# Patient Record
Sex: Male | Born: 1945 | Race: White | Hispanic: No | Smoking: Never smoker
Health system: Southern US, Community
[De-identification: ages and names within clinical notes are randomized; demographics above are authoritative.]

## PROBLEM LIST (undated history)

## (undated) DIAGNOSIS — I10 Essential (primary) hypertension: Secondary | ICD-10-CM

---

## 2020-04-18 ENCOUNTER — Emergency Department (HOSPITAL_COMMUNITY): Payer: Medicare Other

## 2020-04-18 ENCOUNTER — Inpatient Hospital Stay (HOSPITAL_COMMUNITY)
Admission: EM | Admit: 2020-04-18 | Discharge: 2020-04-27 | DRG: 177 | Disposition: A | Discharge status: Home Health | Payer: Medicare Other | Attending: Internal Medicine | Admitting: Internal Medicine

## 2020-04-18 ENCOUNTER — Other Ambulatory Visit: Payer: Self-pay

## 2020-04-18 ENCOUNTER — Encounter (HOSPITAL_COMMUNITY): Payer: Self-pay | Admitting: Emergency Medicine

## 2020-04-18 DIAGNOSIS — I10 Essential (primary) hypertension: Secondary | ICD-10-CM

## 2020-04-18 DIAGNOSIS — Z79899 Other long term (current) drug therapy: Secondary | ICD-10-CM | POA: Diagnosis not present

## 2020-04-18 DIAGNOSIS — U071 COVID-19: Secondary | ICD-10-CM | POA: Diagnosis present

## 2020-04-18 DIAGNOSIS — J1282 Pneumonia due to coronavirus disease 2019: Principal | ICD-10-CM

## 2020-04-18 DIAGNOSIS — T380X5A Adverse effect of glucocorticoids and synthetic analogues, initial encounter: Secondary | ICD-10-CM | POA: Diagnosis not present

## 2020-04-18 DIAGNOSIS — R0602 Shortness of breath: Secondary | ICD-10-CM | POA: Diagnosis present

## 2020-04-18 DIAGNOSIS — Z681 Body mass index (BMI) 19 or less, adult: Secondary | ICD-10-CM | POA: Diagnosis not present

## 2020-04-18 DIAGNOSIS — E785 Hyperlipidemia, unspecified: Secondary | ICD-10-CM | POA: Diagnosis present

## 2020-04-18 DIAGNOSIS — J9601 Acute respiratory failure with hypoxia: Secondary | ICD-10-CM | POA: Diagnosis present

## 2020-04-18 DIAGNOSIS — Z20822 Contact with and (suspected) exposure to covid-19: Secondary | ICD-10-CM | POA: Diagnosis present

## 2020-04-18 DIAGNOSIS — J9600 Acute respiratory failure, unspecified whether with hypoxia or hypercapnia: Secondary | ICD-10-CM | POA: Diagnosis present

## 2020-04-18 DIAGNOSIS — I110 Hypertensive heart disease with heart failure: Secondary | ICD-10-CM | POA: Diagnosis present

## 2020-04-18 DIAGNOSIS — R739 Hyperglycemia, unspecified: Secondary | ICD-10-CM | POA: Diagnosis not present

## 2020-04-18 DIAGNOSIS — R7989 Other specified abnormal findings of blood chemistry: Secondary | ICD-10-CM | POA: Diagnosis not present

## 2020-04-18 DIAGNOSIS — J069 Acute upper respiratory infection, unspecified: Secondary | ICD-10-CM | POA: Diagnosis present

## 2020-04-18 DIAGNOSIS — R413 Other amnesia: Secondary | ICD-10-CM | POA: Diagnosis present

## 2020-04-18 DIAGNOSIS — R636 Underweight: Secondary | ICD-10-CM | POA: Diagnosis present

## 2020-04-18 DIAGNOSIS — I5031 Acute diastolic (congestive) heart failure: Secondary | ICD-10-CM | POA: Diagnosis present

## 2020-04-18 HISTORY — DX: Essential (primary) hypertension: I10

## 2020-04-18 LAB — RESPIRATORY PANEL BY RT PCR (FLU A&B, COVID)
Influenza A by PCR: NEGATIVE
Influenza B by PCR: NEGATIVE
SARS Coronavirus 2 by RT PCR: POSITIVE — AB

## 2020-04-18 LAB — CBC WITH DIFFERENTIAL/PLATELET
Abs Immature Granulocytes: 0.05 10*3/uL (ref 0.00–0.07)
Basophils Absolute: 0 10*3/uL (ref 0.0–0.1)
Basophils Relative: 0 %
Eosinophils Absolute: 0 10*3/uL (ref 0.0–0.5)
Eosinophils Relative: 0 %
HCT: 43.9 % (ref 39.0–52.0)
Hemoglobin: 14.6 g/dL (ref 13.0–17.0)
Immature Granulocytes: 1 %
Lymphocytes Relative: 5 %
Lymphs Abs: 0.3 10*3/uL — ABNORMAL LOW (ref 0.7–4.0)
MCH: 27.2 pg (ref 26.0–34.0)
MCHC: 33.3 g/dL (ref 30.0–36.0)
MCV: 81.8 fL (ref 80.0–100.0)
Monocytes Absolute: 0.1 10*3/uL (ref 0.1–1.0)
Monocytes Relative: 2 %
Neutro Abs: 4.4 10*3/uL (ref 1.7–7.7)
Neutrophils Relative %: 92 %
Platelets: 151 10*3/uL (ref 150–400)
RBC: 5.37 MIL/uL (ref 4.22–5.81)
RDW: 12.6 % (ref 11.5–15.5)
WBC: 4.9 10*3/uL (ref 4.0–10.5)
nRBC: 0 % (ref 0.0–0.2)

## 2020-04-18 LAB — COMPREHENSIVE METABOLIC PANEL
ALT: 36 U/L (ref 0–44)
AST: 61 U/L — ABNORMAL HIGH (ref 15–41)
Albumin: 2.8 g/dL — ABNORMAL LOW (ref 3.5–5.0)
Alkaline Phosphatase: 28 U/L — ABNORMAL LOW (ref 38–126)
Anion gap: 12 (ref 5–15)
BUN: 30 mg/dL — ABNORMAL HIGH (ref 8–23)
CO2: 24 mmol/L (ref 22–32)
Calcium: 8.4 mg/dL — ABNORMAL LOW (ref 8.9–10.3)
Chloride: 104 mmol/L (ref 98–111)
Creatinine, Ser: 1.17 mg/dL (ref 0.61–1.24)
GFR, Estimated: >60 mL/min (ref >60)
Glucose, Bld: 140 mg/dL — ABNORMAL HIGH (ref 70–99)
Potassium: 4 mmol/L (ref 3.5–5.1)
Sodium: 140 mmol/L (ref 135–145)
Total Bilirubin: 1.7 mg/dL — ABNORMAL HIGH (ref 0.3–1.2)
Total Protein: 6.7 g/dL (ref 6.5–8.1)

## 2020-04-18 LAB — LACTATE DEHYDROGENASE: LDH: 670 U/L — ABNORMAL HIGH (ref 98–192)

## 2020-04-18 LAB — TRIGLYCERIDES: Triglycerides: 170 mg/dL — ABNORMAL HIGH (ref <150)

## 2020-04-18 LAB — D-DIMER, QUANTITATIVE: D-Dimer, Quant: 3.5 ug/mL-FEU — ABNORMAL HIGH (ref 0.00–0.50)

## 2020-04-18 LAB — C-REACTIVE PROTEIN: CRP: 21.4 mg/dL — ABNORMAL HIGH (ref <1.0)

## 2020-04-18 LAB — PROCALCITONIN: Procalcitonin: 2.31 ng/mL

## 2020-04-18 LAB — LACTIC ACID, PLASMA: Lactic Acid, Venous: 2.9 mmol/L (ref 0.5–1.9)

## 2020-04-18 LAB — FERRITIN: Ferritin: >7500 ng/mL — ABNORMAL HIGH (ref 24–336)

## 2020-04-18 LAB — FIBRINOGEN: Fibrinogen: 759 mg/dL — ABNORMAL HIGH (ref 210–475)

## 2020-04-18 MED ORDER — HYDROCHLOROTHIAZIDE 12.5 MG PO CAPS: 12.5000 mg | ORAL_CAPSULE | Freq: Every day | ORAL | Status: DC

## 2020-04-18 MED ORDER — SODIUM CHLORIDE 0.9 % IV SOLN
2.0000 g | INTRAVENOUS | Status: AC
  Administered 2020-04-19 – 2020-04-23 (×5): 2 g via INTRAVENOUS
  Filled 2020-04-18 (×5): qty 20

## 2020-04-18 MED ORDER — ACETAMINOPHEN 650 MG RE SUPP: 650.0000 mg | Freq: Four times a day (QID) | RECTAL | Status: DC | PRN

## 2020-04-18 MED ORDER — PREDNISONE 20 MG PO TABS: 50.0000 mg | ORAL_TABLET | Freq: Every day | ORAL | Status: DC

## 2020-04-18 MED ORDER — LISINOPRIL 10 MG PO TABS
10.0000 mg | ORAL_TABLET | Freq: Every day | ORAL | Status: DC
  Administered 2020-04-19 – 2020-04-21 (×3): 10 mg via ORAL
  Filled 2020-04-18 (×3): qty 1

## 2020-04-18 MED ORDER — SODIUM CHLORIDE 0.9 % IV BOLUS
500.0000 mL | Freq: Once | INTRAVENOUS | Status: AC
  Administered 2020-04-18: 500 mL via INTRAVENOUS

## 2020-04-18 MED ORDER — ACETAMINOPHEN 325 MG PO TABS: 650.0000 mg | ORAL_TABLET | Freq: Four times a day (QID) | ORAL | Status: DC | PRN

## 2020-04-18 MED ORDER — METHYLPREDNISOLONE SODIUM SUCC 40 MG IJ SOLR
0.5000 mg/kg | Freq: Two times a day (BID) | INTRAMUSCULAR | Status: DC
  Administered 2020-04-19: 30.8 mg via INTRAVENOUS
  Filled 2020-04-18: qty 1

## 2020-04-18 MED ORDER — SODIUM CHLORIDE 0.9 % IV SOLN
2.0000 g | Freq: Once | INTRAVENOUS | Status: AC
  Administered 2020-04-18: 2 g via INTRAVENOUS
  Filled 2020-04-18: qty 20

## 2020-04-18 MED ORDER — METHYLPREDNISOLONE SODIUM SUCC 125 MG IJ SOLR
125.0000 mg | Freq: Once | INTRAMUSCULAR | Status: AC
  Administered 2020-04-18: 125 mg via INTRAVENOUS
  Filled 2020-04-18: qty 2

## 2020-04-18 MED ORDER — ASCORBIC ACID 500 MG PO TABS
500.0000 mg | ORAL_TABLET | Freq: Every day | ORAL | Status: DC
  Administered 2020-04-19 – 2020-04-27 (×9): 500 mg via ORAL
  Filled 2020-04-18 (×9): qty 1

## 2020-04-18 MED ORDER — SODIUM CHLORIDE 0.9 % IV SOLN
100.0000 mg | Freq: Every day | INTRAVENOUS | Status: AC
  Administered 2020-04-19 – 2020-04-22 (×4): 100 mg via INTRAVENOUS
  Filled 2020-04-18 (×5): qty 20

## 2020-04-18 MED ORDER — PRAVASTATIN SODIUM 40 MG PO TABS
40.0000 mg | ORAL_TABLET | Freq: Every day | ORAL | Status: DC
  Administered 2020-04-19 – 2020-04-26 (×8): 40 mg via ORAL
  Filled 2020-04-18 (×8): qty 1

## 2020-04-18 MED ORDER — SODIUM CHLORIDE 0.9 % IV SOLN
200.0000 mg | Freq: Once | INTRAVENOUS | Status: AC
  Administered 2020-04-18: 200 mg via INTRAVENOUS
  Filled 2020-04-18: qty 40

## 2020-04-18 MED ORDER — ZINC SULFATE 220 (50 ZN) MG PO CAPS
220.0000 mg | ORAL_CAPSULE | Freq: Every day | ORAL | Status: DC
  Administered 2020-04-19 – 2020-04-27 (×9): 220 mg via ORAL
  Filled 2020-04-18 (×9): qty 1

## 2020-04-18 MED ORDER — SODIUM CHLORIDE 0.9 % IV SOLN
500.0000 mg | INTRAVENOUS | Status: AC
  Administered 2020-04-19 – 2020-04-21 (×3): 500 mg via INTRAVENOUS
  Filled 2020-04-18 (×5): qty 500

## 2020-04-18 MED ORDER — ACETAMINOPHEN 325 MG PO TABS
650.0000 mg | ORAL_TABLET | Freq: Once | ORAL | Status: AC
  Administered 2020-04-18: 650 mg via ORAL
  Filled 2020-04-18: qty 2

## 2020-04-18 MED ORDER — LISINOPRIL-HYDROCHLOROTHIAZIDE 10-12.5 MG PO TABS: 1.0000 | ORAL_TABLET | Freq: Every day | ORAL | Status: DC

## 2020-04-18 MED ORDER — ENOXAPARIN SODIUM 40 MG/0.4ML ~~LOC~~ SOLN
40.0000 mg | SUBCUTANEOUS | Status: DC
  Administered 2020-04-19 – 2020-04-20 (×2): 40 mg via SUBCUTANEOUS
  Filled 2020-04-18 (×2): qty 0.4

## 2020-04-18 NOTE — H&P (Signed)
History and Physical    Trevor Martinez SFK:[id number] DOB: 1946-05-12 DOA: 04/18/2020  PCP: No primary care provider on file.  Patient coming from: Home.  Chief Complaint: Hypoxia.  HPI: Trevor Martinez is a 74 y.o. male with history of hypertension and hyperlipidemia was brought to the ER after patient was found to be hypoxic febrile and was having some diarrhea and feeling weak.  Patient's neighbor called in for a wellness check since patient was not seen outside the house for the last 2 weeks.  Patient is a very poor historian and states he is here to the hospital because of Covid.  Denies any chest pain shortness of breath.  ED Course: In the ER patient was requiring 6 L oxygen to maintain sats more than 90%.  Patient was febrile with temperature 102 F.  Chest x-ray showed bilateral infiltrates and also hilar fullness.  Covid test was positive.  Labs are significant for CRP of 21.4 lactic acid 2.9 procalcitonin 2.3 mildly elevated AST.  D-dimer was 3.5.  EKG shows sinus tachycardia.  Blood cultures obtained and patient started on antiviral steroids for Covid and also since patient has elevated procalcitonin empiric antibiotics were started.  Review of Systems: As per HPI, rest all negative.   Past Medical History:  Diagnosis Date  . Hypertension     History reviewed. No pertinent surgical history.   reports that he has never smoked. He has never used smokeless tobacco. No history on file for alcohol use and drug use.  No Known Allergies  Family History  Family history unknown: Yes    Prior to Admission medications   Medication Sig Start Date End Date Taking? Authorizing Provider  lisinopril-hydrochlorothiazide (ZESTORETIC) 10-12.5 MG tablet Take 1 tablet by mouth daily.   Yes [provider]  pravastatin (PRAVACHOL) 40 MG tablet Take 40 mg by mouth daily with supper.   Yes [provider]    Physical Exam: Constitutional: Moderately built and  nourished. Vitals:   04/18/20 2100 04/18/20 2115 04/18/20 2130 04/18/20 2145  BP: 116/68 113/69 111/67 113/61  Pulse: 82 84 81 73  Resp: 20 (!) 21 (!) 21 (!) 22  Temp:      TempSrc:      SpO2: 100% 100% 100% 100%  Weight:      Height:       Eyes: Anicteric no pallor. ENMT: No discharge from the ears eyes nose or mouth. Neck: No mass felt.  No neck rigidity. Respiratory: No rhonchi or crepitations. Cardiovascular: S1-S2 heard. Abdomen: Soft nontender bowel sounds present. Musculoskeletal: No edema. Skin: No rash. Neurologic: Alert awake oriented to his name and place moves all extremities. Psychiatric: Oriented to name and place.   Labs on Admission: I have personally reviewed following labs and imaging studies  CBC: Recent Labs  Lab 04/18/20 1711  WBC 4.9  NEUTROABS 4.4  HGB 14.6  HCT 43.9  MCV 81.8  PLT 151   Basic Metabolic Panel: Recent Labs  Lab 04/18/20 1711  NA 140  K 4.0  CL 104  CO2 24  GLUCOSE 140*  BUN 30*  CREATININE 1.17  CALCIUM 8.4*   GFR: Estimated Creatinine Clearance: 48.7 mL/min (by C-G formula based on SCr of 1.17 mg/dL). Liver Function Tests: Recent Labs  Lab 04/18/20 1711  AST 61*  ALT 36  ALKPHOS 28*  BILITOT 1.7*  PROT 6.7  ALBUMIN 2.8*   No results for input(s): LIPASE, AMYLASE in the last 168 hours. No results for input(s):  AMMONIA in the last 168 hours. Coagulation Profile: No results for input(s): INR, PROTIME in the last 168 hours. Cardiac Enzymes: No results for input(s): CKTOTAL, CKMB, CKMBINDEX, TROPONINI in the last 168 hours. BNP (last 3 results) No results for input(s): PROBNP in the last 8760 hours. HbA1C: No results for input(s): HGBA1C in the last 72 hours. CBG: No results for input(s): GLUCAP in the last 168 hours. Lipid Profile: Recent Labs    04/18/20 1711  TRIG 170*   Thyroid Function Tests: No results for input(s): TSH, T4TOTAL, FREET4, T3FREE, THYROIDAB in the last 72 hours. Anemia  Panel: Recent Labs    04/18/20 1711  FERRITIN >7,500*   Urine analysis: No results found for: COLORURINE, APPEARANCEUR, LABSPEC, PHURINE, GLUCOSEU, HGBUR, BILIRUBINUR, KETONESUR, PROTEINUR, UROBILINOGEN, NITRITE, LEUKOCYTESUR Sepsis Labs: @LABRCNTIP (procalcitonin:4,lacticidven:4) ) Recent Results (from the past 240 hour(s))  Respiratory Panel by RT PCR (Flu A&B, Covid) - Nasopharyngeal Swab     Status: Abnormal   Collection Time: 04/18/20  6:09 PM   Specimen: Nasopharyngeal Swab  Result Value Ref Range Status   SARS Coronavirus 2 by RT PCR POSITIVE (A) NEGATIVE Final    Comment: RESULT CALLED TO, READ BACK BY AND VERIFIED WITH: DR D RAY @2029  04/18/20 BY S GEZAHEGN (NOTE) SARS-CoV-2 target nucleic acids are DETECTED.  SARS-CoV-2 RNA is generally detectable in upper respiratory specimens  during the acute phase of infection. Positive results are indicative of the presence of the identified virus, but do not rule out bacterial infection or co-infection with other pathogens not detected by the test. Clinical correlation with patient history and other diagnostic information is necessary to determine patient infection status. The expected result is Negative.  Fact Sheet for Patients:   Fact Sheet for Healthcare Providers: 04/20/20  This test is not yet approved or cleared by the https://www.moore.com/ FDA and  has been authorized for detection and/or diagnosis of SARS-CoV-2 by FDA under an Emergency Use Authorization (EUA).  This EUA will remain in effect (meaning this test can  be used) for the duration of  the COVID-19 declaration under Section 564(b)(1) of the Act, 21 U.S.C. section 360bbb-3(b)(1), unless the authorization is terminated or revoked sooner.      Influenza A by PCR NEGATIVE NEGATIVE Final   Influenza B by PCR NEGATIVE NEGATIVE Final    Comment: (NOTE) The Xpert Xpress SARS-CoV-2/FLU/RSV  assay is intended as an aid in  the diagnosis of influenza from Nasopharyngeal swab specimens and  should not be used as a sole basis for treatment. Nasal washings and  aspirates are unacceptable for Xpert Xpress SARS-CoV-2/FLU/RSV  testing.  Fact Sheet for Patients: https://www.young.biz/  Fact Sheet for Healthcare Providers: [place]  This test is not yet approved or cleared by the https://www.moore.com/ FDA and  has been authorized for detection and/or diagnosis of SARS-CoV-2 by  FDA under an Emergency Use Authorization (EUA). This EUA will remain  in effect (meaning this test can be used) for the duration of the  Covid-19 declaration under Section 564(b)(1) of the Act, 21  U.S.C. section 360bbb-3(b)(1), unless the authorization is  terminated or revoked. Performed at [facility], [address], [place], [address] [place]      Radiological Exams on Admission: DG Chest Port 1 View  Result Date: 04/18/2020 CLINICAL DATA:  Shortness of breath. EXAM: PORTABLE CHEST 1 VIEW COMPARISON:  None. FINDINGS: The heart is normal in size. There is soft tissue fullness in the left hilar region. Diffuse fine interstitial opacities,  left greater than right and most prominent in the lower lung zones. There is slightly more patchy opacity at the left lung base. No pneumothorax or large pleural effusion. Biapical pleuroparenchymal scarring. No acute osseous abnormalities are seen. IMPRESSION: 1. Diffuse fine interstitial opacities, left greater than right and most prominent in the lower lung zones. Differential considerations favor infection, less likely pulmonary edema, neoplasm, or interstitial lung disease. 2. Soft tissue fullness in the left hilar region. Suspect underlying adenopathy. 3. Recommend clinical correlation. Radiographic follow-up is recommended after course of treatment. Should hilar fullness persist, recommend further characterization  with chest CT. Electronically Signed   By: Narda Rutherford M.D.   On: 04/18/2020 18:40    EKG: Independently reviewed.  Sinus tachycardia.  Assessment/Plan Active Problems:   Acute respiratory failure due to COVID-19 [facility])   Essential hypertension   Acute respiratory disease due to COVID-19 virus    1. Acute respiratory failure with hypoxia presently on 6 L oxygen to maintain sats with chest x-ray showing bilateral infiltrates and positive Covid test likely from Covid pneumonia for which patient is placed on IV remdesivir and IV Solu-Medrol.  Closely monitor respiratory status inflammatory markers.  May need baricitinib if patient develops further hypoxia.  Since patient has elevated procalcitonin levels and I have placed patient on empiric antibiotics follow Cultures lactic acid levels. 2. Since CT scan shows hilar fullness we will get a CT angiogram of the chest to further assess. 3. Hypertension on lisinopril and hydrochlorothiazide. 4. Hyperlipidemia on statins. 5. Patient is a poor historian.  We need to see if he can reach family.  Since patient is acute respiratory failure with hypoxia secondary to Covid infection will need close monitoring and inpatient status.   DVT prophylaxis: Lovenox. Code Status: Full code. Family Communication: We will need to reach with the family. Disposition Plan: Home when stable. Consults called: None. Admission status: Inpatient.   Eduard Clos MD [facility] Pager [phone].  If 7PM-7AM, please contact night-coverage [email or web address] Password [email or web address]  04/18/2020, 10:21 PM

## 2020-04-18 NOTE — ED Triage Notes (Addendum)
Pt here from home via EMS>   Neighbor called 911 for a wellness check because she hadnt seen him in 2 weeks.  Upon EMS arrival, Pt hypoxic in the mid 70s placed on NRB with improvement, febrile, dry nonproductive cough, diarrhea, and weakness.  Pt was initally refusing EMS transport to hospital, but pt unable to ambulate on scene and complied.   Pt reports no complaints at this time. Currently, pt febrile and hypoxic-- 87 on RA. 6L [place] placed on pt-- 99%

## 2020-04-18 NOTE — ED Notes (Signed)
With the patient permisson the friend  would like an update her name janice [phone]

## 2020-04-18 NOTE — ED Provider Notes (Signed)
[facility] EMERGENCY DEPARTMENT Provider Note   CSN: [id number] Arrival date & time: 04/18/20  1638     History Chief Complaint  Patient presents with  . Shortness of Breath    Trevor Martinez is a 74 y.o. male.  HPI    Level 5 caveat- patient does not give history- unclear etiology ?due to illness, underlying memory problems 74 year old male brought in today via EMS.  She reported that he was transported because they were called out for a wellness check.  Neighbor reported that she had not seen him for 2 weeks.  EMS reported that patient was hypoxic on their arrival and required nonrebreather mask due to sats in the mid 70s.  He had a nonproductive cough, diarrhea, and was febrile.  He was refusing transport to the hospital but was unable to walk on the scene complied with transport.  Here patient states he is not sure why he was brought in.  He states everyone tells me he is sick but that he is not feel that he is sick. No past medical history on file.  There are no problems to display for this patient.   History reviewed. No pertinent surgical history.     No family history on file.  Social History   Tobacco Use  . Smoking status: Not on file  Substance Use Topics  . Alcohol use: Not on file  . Drug use: Not on file    Home Medications Prior to Admission medications   Not on File    Allergies    Patient has no known allergies.  Review of Systems   Review of Systems  Unable to perform ROS: Other    Physical Exam Updated Vital Signs BP (!) 144/75   Pulse (!) 103   Temp (!) 102.4 F (39.1 C) (Oral)   Resp (!) 25   Ht 1.778 m (5\' 10" )   Wt 61.2 kg   SpO2 99%   BMI 19.37 kg/m   Physical Exam Vitals and nursing note reviewed.  Constitutional:      General: He is not in acute distress.    Appearance: He is well-developed.     Comments: Patient is febrile to 102.4 and tachycardic to 103 Sats are 99% on nasal cannula  HENT:      Head: Normocephalic.     Mouth/Throat:     Pharynx: Oropharynx is clear.  Eyes:     Pupils: Pupils are equal, round, and reactive to light.  Cardiovascular:     Rate and Rhythm: Tachycardia present.  Pulmonary:     Effort: Tachypnea present.     Breath sounds: Examination of the right-lower field reveals decreased breath sounds and rhonchi. Examination of the left-lower field reveals decreased breath sounds and rhonchi. Decreased breath sounds and rhonchi present.  Abdominal:     Palpations: Abdomen is soft.  Musculoskeletal:        General: Normal range of motion.     Cervical back: Normal range of motion.  Skin:    General: Skin is warm and dry.     Capillary Refill: Capillary refill takes less than 2 seconds.  Neurological:     General: No focal deficit present.     Mental Status: He is alert.     ED Results / Procedures / Treatments   Labs (all labs ordered are listed, but only abnormal results are displayed) Labs Reviewed  COMPREHENSIVE METABOLIC PANEL - Abnormal; Notable for the following components:  Result Value   Glucose, Bld 140 (*)    BUN 30 (*)    Calcium 8.4 (*)    Albumin 2.8 (*)    AST 61 (*)    Alkaline Phosphatase 28 (*)    Total Bilirubin 1.7 (*)    All other components within normal limits  RESPIRATORY PANEL BY PCR  RESPIRATORY PANEL BY RT PCR (FLU A&B, COVID)  CULTURE, BLOOD (ROUTINE X 2)  CULTURE, BLOOD (ROUTINE X 2)  CBC WITH DIFFERENTIAL/PLATELET  LACTIC ACID, PLASMA  LACTIC ACID, PLASMA  URINALYSIS, ROUTINE W REFLEX MICROSCOPIC  D-DIMER, QUANTITATIVE (NOT AT [facility])  PROCALCITONIN  LACTATE DEHYDROGENASE  FERRITIN  TRIGLYCERIDES  FIBRINOGEN  C-REACTIVE PROTEIN    EKG EKG Interpretation  Date/Time:  Sunday April 18 2020 16:51:20 EDT Ventricular Rate:  109 PR Interval:    QRS Duration: 104 QT Interval:  324 QTC Calculation: 437 R Axis:   97 Text Interpretation: Sinus tachycardia Right axis deviation Confirmed by Margarita Grizzle  [phone]) on 04/18/2020 8:56:53 PM   Radiology DG Chest Port 1 View  Result Date: 04/18/2020 CLINICAL DATA:  Shortness of breath. EXAM: PORTABLE CHEST 1 VIEW COMPARISON:  None. FINDINGS: The heart is normal in size. There is soft tissue fullness in the left hilar region. Diffuse fine interstitial opacities, left greater than right and most prominent in the lower lung zones. There is slightly more patchy opacity at the left lung base. No pneumothorax or large pleural effusion. Biapical pleuroparenchymal scarring. No acute osseous abnormalities are seen. IMPRESSION: 1. Diffuse fine interstitial opacities, left greater than right and most prominent in the lower lung zones. Differential considerations favor infection, less likely pulmonary edema, neoplasm, or interstitial lung disease. 2. Soft tissue fullness in the left hilar region. Suspect underlying adenopathy. 3. Recommend clinical correlation. Radiographic follow-up is recommended after course of treatment. Should hilar fullness persist, recommend further characterization with chest CT. Electronically Signed   By: Narda Rutherford M.D.   On: 04/18/2020 18:40    Procedures Procedures (including critical care time)  Medications Ordered in ED Medications - No data to display  ED Course  I have reviewed the triage vital signs and the nursing notes.  Pertinent labs & imaging results that were available during my care of the patient were reviewed by me and considered in my medical decision making (see chart for details).  Clinical Course as of Apr 18 1828  Wynelle Link Apr 18, 2020  1828 Cbc, cmet lactic acid reviewed   [DR]  1828 DG Chest [facility] 1 View [DR]    Clinical Course User Index [DR] Margarita Grizzle, MD   MDM Rules/Calculators/A&P                          Patient with cough,hypoxia, fever, mild tachycardia- elevated lactic acid. Unknown duration as patient does not identify as being sick.  Patient on  Will give 500 cc ns as covid is most  likely dx and patient normotensive. Patiet covid positive here in ED. Discussed with Dr. Toniann Fail  Solumedrol and remdesivir ordered He will see for admission  Final Clinical Impression(s) / ED Diagnoses Final diagnoses:  Pneumonia due to COVID-19 virus    Rx / DC Orders ED Discharge Orders    None       Margarita Grizzle, MD 04/18/20 2106

## 2020-04-19 ENCOUNTER — Inpatient Hospital Stay (HOSPITAL_COMMUNITY): Payer: Medicare Other

## 2020-04-19 DIAGNOSIS — J1282 Pneumonia due to coronavirus disease 2019: Secondary | ICD-10-CM

## 2020-04-19 DIAGNOSIS — E785 Hyperlipidemia, unspecified: Secondary | ICD-10-CM

## 2020-04-19 LAB — URINALYSIS, ROUTINE W REFLEX MICROSCOPIC
Bacteria, UA: NONE SEEN
Bilirubin Urine: NEGATIVE
Glucose, UA: NEGATIVE mg/dL
Ketones, ur: NEGATIVE mg/dL
Leukocytes,Ua: NEGATIVE
Nitrite: NEGATIVE
Protein, ur: 100 mg/dL — AB
Specific Gravity, Urine: 1.029 (ref 1.005–1.030)
pH: 5 (ref 5.0–8.0)

## 2020-04-19 LAB — COMPREHENSIVE METABOLIC PANEL
ALT: 30 U/L (ref 0–44)
AST: 47 U/L — ABNORMAL HIGH (ref 15–41)
Albumin: 2.3 g/dL — ABNORMAL LOW (ref 3.5–5.0)
Alkaline Phosphatase: 25 U/L — ABNORMAL LOW (ref 38–126)
Anion gap: 12 (ref 5–15)
BUN: 28 mg/dL — ABNORMAL HIGH (ref 8–23)
CO2: 20 mmol/L — ABNORMAL LOW (ref 22–32)
Calcium: 7.9 mg/dL — ABNORMAL LOW (ref 8.9–10.3)
Chloride: 106 mmol/L (ref 98–111)
Creatinine, Ser: 1.06 mg/dL (ref 0.61–1.24)
GFR, Estimated: >60 mL/min (ref >60)
Glucose, Bld: 257 mg/dL — ABNORMAL HIGH (ref 70–99)
Potassium: 4.2 mmol/L (ref 3.5–5.1)
Sodium: 138 mmol/L (ref 135–145)
Total Bilirubin: 0.8 mg/dL (ref 0.3–1.2)
Total Protein: 5.8 g/dL — ABNORMAL LOW (ref 6.5–8.1)

## 2020-04-19 LAB — CBC WITH DIFFERENTIAL/PLATELET
Abs Immature Granulocytes: 0.02 10*3/uL (ref 0.00–0.07)
Basophils Absolute: 0 10*3/uL (ref 0.0–0.1)
Basophils Relative: 0 %
Eosinophils Absolute: 0 10*3/uL (ref 0.0–0.5)
Eosinophils Relative: 0 %
HCT: 38.1 % — ABNORMAL LOW (ref 39.0–52.0)
Hemoglobin: 13.1 g/dL (ref 13.0–17.0)
Immature Granulocytes: 1 %
Lymphocytes Relative: 4 %
Lymphs Abs: 0.2 10*3/uL — ABNORMAL LOW (ref 0.7–4.0)
MCH: 27.9 pg (ref 26.0–34.0)
MCHC: 34.4 g/dL (ref 30.0–36.0)
MCV: 81.2 fL (ref 80.0–100.0)
Monocytes Absolute: 0.1 10*3/uL (ref 0.1–1.0)
Monocytes Relative: 2 %
Neutro Abs: 3.9 10*3/uL (ref 1.7–7.7)
Neutrophils Relative %: 93 %
Platelets: 130 10*3/uL — ABNORMAL LOW (ref 150–400)
RBC: 4.69 MIL/uL (ref 4.22–5.81)
RDW: 12.8 % (ref 11.5–15.5)
WBC: 4.3 10*3/uL (ref 4.0–10.5)
nRBC: 0 % (ref 0.0–0.2)

## 2020-04-19 LAB — TROPONIN I (HIGH SENSITIVITY): Troponin I (High Sensitivity): 17 ng/L (ref <18)

## 2020-04-19 LAB — D-DIMER, QUANTITATIVE: D-Dimer, Quant: 3.21 ug/mL-FEU — ABNORMAL HIGH (ref 0.00–0.50)

## 2020-04-19 LAB — LACTIC ACID, PLASMA: Lactic Acid, Venous: 3.8 mmol/L (ref 0.5–1.9)

## 2020-04-19 LAB — C-REACTIVE PROTEIN: CRP: 21.4 mg/dL — ABNORMAL HIGH (ref <1.0)

## 2020-04-19 MED ORDER — METHYLPREDNISOLONE SODIUM SUCC 125 MG IJ SOLR
50.0000 mg | Freq: Two times a day (BID) | INTRAMUSCULAR | Status: DC
  Administered 2020-04-19 – 2020-04-24 (×10): 50 mg via INTRAVENOUS
  Filled 2020-04-19 (×10): qty 2

## 2020-04-19 MED ORDER — BENZONATATE 100 MG PO CAPS
200.0000 mg | ORAL_CAPSULE | Freq: Three times a day (TID) | ORAL | Status: DC
  Administered 2020-04-19 – 2020-04-27 (×23): 200 mg via ORAL
  Filled 2020-04-19 (×23): qty 2

## 2020-04-19 MED ORDER — ALBUTEROL SULFATE HFA 108 (90 BASE) MCG/ACT IN AERS
2.0000 | INHALATION_SPRAY | RESPIRATORY_TRACT | Status: DC | PRN
  Filled 2020-04-19: qty 6.7

## 2020-04-19 MED ORDER — BARICITINIB 2 MG PO TABS
4.0000 mg | ORAL_TABLET | Freq: Every day | ORAL | Status: DC
  Administered 2020-04-19 – 2020-04-22 (×4): 4 mg via ORAL
  Filled 2020-04-19 (×4): qty 2

## 2020-04-19 MED ORDER — IOHEXOL 350 MG/ML SOLN
75.0000 mL | Freq: Once | INTRAVENOUS | Status: AC | PRN
  Administered 2020-04-19: 75 mL via INTRAVENOUS

## 2020-04-19 NOTE — Progress Notes (Signed)
PROGRESS NOTE                                                                                                                                                                                                             Patient Demographics:    Trevor Martinez, is a 74 y.o. male, DOB - 04-30-1946, JXB:[id number]  Outpatient Primary MD for the patient is No primary care provider on file.   Admit date - 04/18/2020   LOS - 1  Chief Complaint  Patient presents with  . Shortness of Breath       Brief Narrative: Patient is a 74 y.o. male with PMHx of HTN, HLD-who for the past 1 week or so-has been having cough, chest pain, diarrhea-neighbor called EMS-subsequent evaluation revealed acute hypoxic respiratory failure due to COVID-19 pneumonia  COVID-19 vaccinated status: Vaccinated  Significant Events: 11/1>> [facility] for hypoxia due to COVID-19 pneumonia  Significant studies: 10/31>>Chest x-ray: Interstitial opacities bilaterally, soft tissue fullness in the left hilar region. 11/1>> CTA chest: No PE, multifocal pneumonia, coronary artery calcifications.  No enlarged mediastinal lymph nodes.  COVID-19 medications: Steroids: 10/31>> Remdesivir: 10/31>> Baricitinib: 11/1>>  Antibiotics: Rocephin: 10/31>> Zithromax: 10/31>>  Microbiology data: 10/31 >>blood culture:No growth  Procedures: None  Consults: None  DVT prophylaxis: enoxaparin (LOVENOX) injection 40 mg Start: 04/19/20 1000   Subjective:    Trevor Martinez today is stable on 6 L of oxygen-not short of breath at rest.  No nausea vomiting.   Assessment  & Plan :   Acute Hypoxic Resp Failure due to Covid 19 Viral pneumonia and possible concurrent bacterial pneumonia: Appears comfortable-requiring around 6 L of oxygen this morning-plans are to continue steroids/Remdesivir and empiric Rocephin/Zithromax for a mildly elevated procalcitonin and presumed  bacterial pneumonia.  Discussed use of baricitinib with patient-understands that this is under EUA by FDA-no history of TB, hepatitis B, diverticulitis-understands risks/benefits-and consents to proceed.  Fever: afebrile O2 requirements:  SpO2: 93 % O2 Flow Rate (L/min): 6 L/min   COVID-19 Labs: Recent Labs    04/18/20 1711 04/19/20 0441  DDIMER 3.50* 3.21*  FERRITIN >7,500*  --   LDH 670*  --   CRP 21.4* 21.4*    No results found for: BNP  Recent Labs  Lab 04/18/20 1711  PROCALCITON 2.31    Lab Results  Component Value Date  SARSCOV2NAA POSITIVE (A) 04/18/2020     Prone/Incentive Spirometry: encouraged  incentive spirometry use 3-4/hour.  Transaminitis: Mild-likely secondary to COVID-19-stable for follow-up.  HTN: Continue lisinopril-hold HCTZ-follow and adjust.  HLD: Continue statin-watch LFTs.   ABG: No results found for: PHART, PCO2ART, PO2ART, HCO3, TCO2, ACIDBASEDEF, O2SAT  Vent Settings: N/A   Condition -Guarded  Family Communication  : Patient is estranged from his son-claims he has a sister but does not know her number-I have asked social work for assistance in contacting his family.  Code Status :  Full Code  Diet :  Diet Order            Diet Heart Room service appropriate? Yes; Fluid consistency: Thin  Diet effective now                  Disposition Plan  :   Status is: Inpatient  Remains inpatient appropriate because:Inpatient level of care appropriate due to severity of illness   Dispo: The patient is from: Home              Anticipated d/c is to: Home              Anticipated d/c date is: > 3 days              Patient currently is not medically stable to d/c.    Barriers to discharge: Hypoxia requiring O2 supplementation/complete 5 days of IV Remdesivir  Antimicorbials  :    Anti-infectives (From admission, onward)   Start     Dose/Rate Route Frequency Ordered Stop   04/19/20 2200  cefTRIAXone (ROCEPHIN) 2 g in sodium  chloride 0.9 % 100 mL IVPB        2 g 200 mL/hr over 30 Minutes Intravenous Every 24 hours 04/18/20 2220     04/19/20 1000  remdesivir 100 mg in sodium chloride 0.9 % 100 mL IVPB       "Followed by" Linked Group Details   100 mg 200 mL/hr over 30 Minutes Intravenous Daily 04/18/20 2137 04/23/20 0959   04/19/20 0000  azithromycin (ZITHROMAX) 500 mg in sodium chloride 0.9 % 250 mL IVPB        500 mg 250 mL/hr over 60 Minutes Intravenous Every 24 hours 04/18/20 2220     04/18/20 2300  remdesivir 200 mg in sodium chloride 0.9% 250 mL IVPB       "Followed by" Linked Group Details   200 mg 580 mL/hr over 30 Minutes Intravenous Once 04/18/20 2137 04/19/20 0003   04/18/20 2300  cefTRIAXone (ROCEPHIN) 2 g in sodium chloride 0.9 % 100 mL IVPB        2 g 200 mL/hr over 30 Minutes Intravenous  Once 04/18/20 2258 04/19/20 0001      Inpatient Medications  Scheduled Meds: . vitamin C  500 mg Oral Daily  . enoxaparin (LOVENOX) injection  40 mg Subcutaneous Q24H  . lisinopril  10 mg Oral Daily  . methylPREDNISolone (SOLU-MEDROL) injection  50 mg Intravenous Q12H  . pravastatin  40 mg Oral Q supper  . zinc sulfate  220 mg Oral Daily   Continuous Infusions: . azithromycin 500 mg (04/19/20 0101)  . cefTRIAXone (ROCEPHIN)  IV    . remdesivir 100 mg in NS 100 mL 100 mg (04/19/20 0952)   PRN Meds:.acetaminophen **OR** acetaminophen   Time Spent in minutes  35   See all Orders from today for further details   Jeoffrey Massed M.D on 04/19/2020 at 11:18 AM  To page go to [email or web address] - use universal password  [facility] -  Office  [phone]    Objective:   Vitals:   04/18/20 2215 04/18/20 2320 04/18/20 2340 04/19/20 0400  BP: 118/71  (!) 150/71 123/69  Pulse: 79  89 80  Resp: (!) 22  (!) 24 (!) 24  Temp:  98.3 F (36.8 C) 98.3 F (36.8 C) 97.8 F (36.6 C)  TempSrc:   Oral Oral  SpO2: 100%  94% 93%  Weight:      Height:        Wt Readings from Last 3 Encounters:   04/18/20 61.2 kg     Intake/Output Summary (Last 24 hours) at 04/19/2020 1118 Last data filed at 04/19/2020 1100 Gross per 24 hour  Intake 876 ml  Output 300 ml  Net 576 ml     Physical Exam Gen Exam:Alert awake-not in any distress HEENT:atraumatic, normocephalic Chest: B/L clear to auscultation anteriorly CVS:S1S2 regular Abdomen:soft non tender, non distended Extremities:no edema Neurology: Non focal Skin: no rash   Data Review:    CBC Recent Labs  Lab 04/18/20 1711 04/19/20 0441  WBC 4.9 4.3  HGB 14.6 13.1  HCT 43.9 38.1*  PLT 151 130*  MCV 81.8 81.2  MCH 27.2 27.9  MCHC 33.3 34.4  RDW 12.6 12.8  LYMPHSABS 0.3* 0.2*  MONOABS 0.1 0.1  EOSABS 0.0 0.0  BASOSABS 0.0 0.0    Chemistries  Recent Labs  Lab 04/18/20 1711 04/19/20 0441  NA 140 138  K 4.0 4.2  CL 104 106  CO2 24 20*  GLUCOSE 140* 257*  BUN 30* 28*  CREATININE 1.17 1.06  CALCIUM 8.4* 7.9*  AST 61* 47*  ALT 36 30  ALKPHOS 28* 25*  BILITOT 1.7* 0.8   ------------------------------------------------------------------------------------------------------------------ Recent Labs    04/18/20 1711  TRIG 170*    No results found for: HGBA1C ------------------------------------------------------------------------------------------------------------------ No results for input(s): TSH, T4TOTAL, T3FREE, THYROIDAB in the last 72 hours.  Invalid input(s): FREET3 ------------------------------------------------------------------------------------------------------------------ Recent Labs    04/18/20 1711  FERRITIN >7,500*    Coagulation profile No results for input(s): INR, PROTIME in the last 168 hours.  Recent Labs    04/18/20 1711 04/19/20 0441  DDIMER 3.50* 3.21*    Cardiac Enzymes No results for input(s): CKMB, TROPONINI, MYOGLOBIN in the last 168 hours.  Invalid input(s):  CK ------------------------------------------------------------------------------------------------------------------ No results found for: BNP  Micro Results Recent Results (from the past 240 hour(s))  Respiratory Panel by RT PCR (Flu A&B, Covid) - Nasopharyngeal Swab     Status: Abnormal   Collection Time: 04/18/20  6:09 PM   Specimen: Nasopharyngeal Swab  Result Value Ref Range Status   SARS Coronavirus 2 by RT PCR POSITIVE (A) NEGATIVE Final    Comment: RESULT CALLED TO, READ BACK BY AND VERIFIED WITH: DR D RAY @2029  04/18/20 BY S GEZAHEGN (NOTE) SARS-CoV-2 target nucleic acids are DETECTED.  SARS-CoV-2 RNA is generally detectable in upper respiratory specimens  during the acute phase of infection. Positive results are indicative of the presence of the identified virus, but do not rule out bacterial infection or co-infection with other pathogens not detected by the test. Clinical correlation with patient history and other diagnostic information is necessary to determine patient infection status. The expected result is Negative.  Fact Sheet for Patients:  04/20/20  Fact Sheet for Healthcare Providers: https://www.moore.com/  This test is not yet approved or cleared by the https://www.young.biz/ FDA and  has been authorized for detection and/or  diagnosis of SARS-CoV-2 by FDA under an Emergency Use Authorization (EUA).  This EUA will remain in effect (meaning this test can  be used) for the duration of  the COVID-19 declaration under Section 564(b)(1) of the Act, 21 U.S.C. section 360bbb-3(b)(1), unless the authorization is terminated or revoked sooner.      Influenza A by PCR NEGATIVE NEGATIVE Final   Influenza B by PCR NEGATIVE NEGATIVE Final    Comment: (NOTE) The Xpert Xpress SARS-CoV-2/FLU/RSV assay is intended as an aid in  the diagnosis of influenza from Nasopharyngeal swab specimens and  should not be used as a sole  basis for treatment. Nasal washings and  aspirates are unacceptable for Xpert Xpress SARS-CoV-2/FLU/RSV  testing.  Fact Sheet for Patients: https://www.moore.com/  Fact Sheet for Healthcare Providers: https://www.young.biz/  This test is not yet approved or cleared by the [place] FDA and  has been authorized for detection and/or diagnosis of SARS-CoV-2 by  FDA under an Emergency Use Authorization (EUA). This EUA will remain  in effect (meaning this test can be used) for the duration of the  Covid-19 declaration under Section 564(b)(1) of the Act, 21  U.S.C. section 360bbb-3(b)(1), unless the authorization is  terminated or revoked. Performed at [facility], [address], [place], [place] [address]   Blood Culture (routine x 2)     Status: None (Preliminary result)   Collection Time: 04/18/20  6:09 PM   Specimen: BLOOD  Result Value Ref Range Status   Specimen Description BLOOD RIGHT ANTECUBITAL  Final   Special Requests   Final    BOTTLES DRAWN AEROBIC AND ANAEROBIC Blood Culture adequate volume   Culture   Final    NO GROWTH < 24 HOURS Performed at [facility], [address] [place], [place] [address]    Report Status PENDING  Incomplete  Blood Culture (routine x 2)     Status: None (Preliminary result)   Collection Time: 04/19/20  4:41 AM   Specimen: BLOOD  Result Value Ref Range Status   Specimen Description BLOOD LEFT ANTECUBITAL  Final   Special Requests   Final    BOTTLES DRAWN AEROBIC AND ANAEROBIC Blood Culture results may not be optimal due to an excessive volume of blood received in culture bottles   Culture   Final    NO GROWTH < 12 HOURS Performed at [facility], [address] [place], [place] [address]    Report Status PENDING  Incomplete    Radiology Reports CT ANGIO CHEST PE W OR WO CONTRAST  Result Date: 04/19/2020 CLINICAL DATA:  Hypoxic, fever.  COVID-19 positive. EXAM: CT  ANGIOGRAPHY CHEST WITH CONTRAST TECHNIQUE: Multidetector CT imaging of the chest was performed using the standard protocol during bolus administration of intravenous contrast. Multiplanar CT image reconstructions and MIPs were obtained to evaluate the vascular anatomy. CONTRAST:  65mL OMNIPAQUE IOHEXOL 350 MG/ML SOLN COMPARISON:  None. FINDINGS: Cardiovascular: Satisfactory opacification of the pulmonary arteries to the segmental level. No evidence of pulmonary embolism. Normal heart size. No pericardial effusion. Mild coronary artery calcifications are noted. Mediastinum/Nodes: No enlarged mediastinal, hilar, or axillary lymph nodes. Thyroid gland, trachea, and esophagus demonstrate no significant findings. Lungs/Pleura: No pneumothorax or pleural effusion is noted. Bilateral multiple airspace opacities are noted consistent with multifocal pneumonia due to COVID-19. Upper Abdomen: No acute abnormality. Musculoskeletal: No chest wall abnormality. No acute or significant osseous findings. Review of the MIP images confirms the above findings. IMPRESSION: 1. No definite  evidence of pulmonary embolus. 2. Mild coronary artery calcifications are noted suggesting coronary artery disease. 3. Bilateral multiple airspace opacities are noted consistent with multifocal pneumonia due to COVID-19. Electronically Signed   By: Lupita Raider M.D.   On: 04/19/2020 09:55   DG Chest Port 1 View  Result Date: 04/18/2020 CLINICAL DATA:  Shortness of breath. EXAM: PORTABLE CHEST 1 VIEW COMPARISON:  None. FINDINGS: The heart is normal in size. There is soft tissue fullness in the left hilar region. Diffuse fine interstitial opacities, left greater than right and most prominent in the lower lung zones. There is slightly more patchy opacity at the left lung base. No pneumothorax or large pleural effusion. Biapical pleuroparenchymal scarring. No acute osseous abnormalities are seen. IMPRESSION: 1. Diffuse fine interstitial opacities,  left greater than right and most prominent in the lower lung zones. Differential considerations favor infection, less likely pulmonary edema, neoplasm, or interstitial lung disease. 2. Soft tissue fullness in the left hilar region. Suspect underlying adenopathy. 3. Recommend clinical correlation. Radiographic follow-up is recommended after course of treatment. Should hilar fullness persist, recommend further characterization with chest CT. Electronically Signed   By: Narda Rutherford M.D.   On: 04/18/2020 18:40

## 2020-04-19 NOTE — Progress Notes (Signed)
Physical Therapy Evaluation Patient Details Name: Trevor Martinez MRN: [id number] DOB: 23-Jan-1946 Today's Date: 04/19/2020   History of Present Illness  74 y.o. male with history of hypertension and hyperlipidemia was brought to the ER after patient was found to be hypoxic febrile and was having some diarrhea and feeling weak. Patient's neighbor called in for a wellness check since patient was not seen outside the house for the last 2 weeks. +COVID  Clinical Impression   Pt admitted with above diagnosis. Patient is very HOH and difficult to obtain complete history. Had not been seen by neighbor for 2 weeks when they requested well check and was found to be saturating in the 70s when EMS checked on him. He currently required min assist to ambulate 12 feet with HHA with sats decreasing to 84% and slow return to 89% (on 4L). With unknown level of support on discharge, recommend SNF at this time. Pt currently with functional limitations due to the deficits listed below (see PT Problem List). Pt will benefit from skilled PT to increase their independence and safety with mobility to allow discharge to the venue listed below.     At rest, 4L sats 90% HR 84 -4L walking, sats 84% HR 90 -after 3 min seated rest, sats 89% HR 94   Follow Up Recommendations SNF;Supervision/Assistance - 24 hour    Equipment Recommendations  Other (comment) (TBD next venue)    Recommendations for Other Services OT consult     Precautions / Restrictions Precautions Precautions: Fall      Mobility  Bed Mobility Overal bed mobility: Needs Assistance Bed Mobility: Supine to Sit     Supine to sit: Supervision     General bed mobility comments: due to lines    Transfers Overall transfer level: Needs assistance   Transfers: Sit to/from Stand Sit to Stand: Min assist         General transfer comment: unsteady as achieved upright; denied dizziness  Ambulation/Gait Ambulation/Gait assistance: Min  assist Gait Distance (Feet): 12 Feet Assistive device: 1 person hand held assist Gait Pattern/deviations: Step-through pattern;Decreased stride length;Staggering right;Narrow base of support Gait velocity: decr   General Gait Details: initial stagger to her right during first steps with min assist to recover  Stairs            Wheelchair Mobility    Modified Rankin (Stroke Patients Only)       Balance Overall balance assessment: Needs assistance Sitting-balance support: No upper extremity supported;Feet supported Sitting balance-Leahy Scale: Good     Standing balance support: Single extremity supported Standing balance-Leahy Scale: Poor Standing balance comment: needed external support                             Pertinent Vitals/Pain Pain Assessment: No/denies pain    Home Living Family/patient expects to be discharged to:: Private residence Living Arrangements: Alone   Type of Home: House Home Access: Level entry     Home Layout: One level Home Equipment: Environmental consultant - 2 wheels;Walker - 4 wheels;Shower seat Additional Comments: information from patient with ?accuracy    Prior Function Level of Independence: Independent with assistive device(s)         Comments: states he uses rollator     Hand Dominance        Extremity/Trunk Assessment   Upper Extremity Assessment Upper Extremity Assessment: Defer to OT evaluation    Lower Extremity Assessment Lower Extremity Assessment: Generalized weakness  Cervical / Trunk Assessment Cervical / Trunk Assessment: Normal  Communication   Communication: HOH  Cognition Arousal/Alertness: Awake/alert Behavior During Therapy: WFL for tasks assessed/performed Overall Cognitive Status: No family/caregiver present to determine baseline cognitive functioning Area of Impairment: Following commands                       Following Commands: Follows one step commands with increased time        General Comments: oriented x 4 (including day of the week, month, date); slow processing,but ?due to [facility]      General Comments      Exercises     Assessment/Plan    PT Assessment Patient needs continued PT services  PT Problem List Decreased strength;Decreased activity tolerance;Decreased balance;Decreased mobility;Decreased cognition;Decreased knowledge of use of DME;Cardiopulmonary status limiting activity       PT Treatment Interventions DME instruction;Gait training;Functional mobility training;Therapeutic activities;Therapeutic exercise;Balance training;Cognitive remediation;Patient/family education    PT Goals (Current goals can be found in the Care Plan section)  Acute Rehab PT Goals Patient Stated Goal: agrees wants to get stronger PT Goal Formulation: With patient Time For Goal Achievement: 05/03/20 Potential to Achieve Goals: Good    Frequency Min 2X/week   Barriers to discharge Decreased caregiver support lives alone    Co-evaluation               AM-PAC PT "6 Clicks" Mobility  Outcome Measure Help needed turning from your back to your side while in a flat bed without using bedrails?: None Help needed moving from lying on your back to sitting on the side of a flat bed without using bedrails?: None Help needed moving to and from a bed to a chair (including a wheelchair)?: A Little Help needed standing up from a chair using your arms (e.g., wheelchair or bedside chair)?: A Little Help needed to walk in hospital room?: A Little Help needed climbing 3-5 steps with a railing? : A Little 6 Click Score: 20    End of Session Equipment Utilized During Treatment: Gait belt;Oxygen Activity Tolerance: Patient tolerated treatment well Patient left: in chair;with call bell/phone within reach;with chair alarm set Nurse Communication: Mobility status PT Visit Diagnosis: Unsteadiness on feet (R26.81);Muscle weakness (generalized) (M62.81)    Time: 1530-1605 PT  Time Calculation (min) (ACUTE ONLY): 35 min   Charges:   PT Evaluation $PT Eval Low Complexity: 1 Low PT Treatments $Gait Training: 8-22 mins         [name], PT Pager [phone]   [name] 04/19/2020, 5:33 PM

## 2020-04-20 ENCOUNTER — Inpatient Hospital Stay (HOSPITAL_COMMUNITY): Payer: Medicare Other

## 2020-04-20 DIAGNOSIS — R7989 Other specified abnormal findings of blood chemistry: Secondary | ICD-10-CM | POA: Diagnosis not present

## 2020-04-20 LAB — CBC WITH DIFFERENTIAL/PLATELET
Abs Immature Granulocytes: 0.04 10*3/uL (ref 0.00–0.07)
Basophils Absolute: 0 10*3/uL (ref 0.0–0.1)
Basophils Relative: 0 %
Eosinophils Absolute: 0 10*3/uL (ref 0.0–0.5)
Eosinophils Relative: 0 %
HCT: 37.6 % — ABNORMAL LOW (ref 39.0–52.0)
Hemoglobin: 12.8 g/dL — ABNORMAL LOW (ref 13.0–17.0)
Immature Granulocytes: 1 %
Lymphocytes Relative: 10 %
Lymphs Abs: 0.7 10*3/uL (ref 0.7–4.0)
MCH: 27.6 pg (ref 26.0–34.0)
MCHC: 34 g/dL (ref 30.0–36.0)
MCV: 81.2 fL (ref 80.0–100.0)
Monocytes Absolute: 0.2 10*3/uL (ref 0.1–1.0)
Monocytes Relative: 2 %
Neutro Abs: 6.1 10*3/uL (ref 1.7–7.7)
Neutrophils Relative %: 87 %
Platelets: 194 10*3/uL (ref 150–400)
RBC: 4.63 MIL/uL (ref 4.22–5.81)
RDW: 13 % (ref 11.5–15.5)
WBC: 7 10*3/uL (ref 4.0–10.5)
nRBC: 0 % (ref 0.0–0.2)

## 2020-04-20 LAB — COMPREHENSIVE METABOLIC PANEL
ALT: 27 U/L (ref 0–44)
AST: 36 U/L (ref 15–41)
Albumin: 2.3 g/dL — ABNORMAL LOW (ref 3.5–5.0)
Alkaline Phosphatase: 29 U/L — ABNORMAL LOW (ref 38–126)
Anion gap: 13 (ref 5–15)
BUN: 36 mg/dL — ABNORMAL HIGH (ref 8–23)
CO2: 20 mmol/L — ABNORMAL LOW (ref 22–32)
Calcium: 8.6 mg/dL — ABNORMAL LOW (ref 8.9–10.3)
Chloride: 107 mmol/L (ref 98–111)
Creatinine, Ser: 0.95 mg/dL (ref 0.61–1.24)
GFR, Estimated: >60 mL/min (ref >60)
Glucose, Bld: 195 mg/dL — ABNORMAL HIGH (ref 70–99)
Potassium: 3.9 mmol/L (ref 3.5–5.1)
Sodium: 140 mmol/L (ref 135–145)
Total Bilirubin: 0.7 mg/dL (ref 0.3–1.2)
Total Protein: 5.8 g/dL — ABNORMAL LOW (ref 6.5–8.1)

## 2020-04-20 LAB — D-DIMER, QUANTITATIVE: D-Dimer, Quant: 17.35 ug/mL-FEU — ABNORMAL HIGH (ref 0.00–0.50)

## 2020-04-20 LAB — C-REACTIVE PROTEIN: CRP: 16.4 mg/dL — ABNORMAL HIGH (ref <1.0)

## 2020-04-20 MED ORDER — ENOXAPARIN SODIUM 60 MG/0.6ML ~~LOC~~ SOLN
60.0000 mg | Freq: Two times a day (BID) | SUBCUTANEOUS | Status: DC
  Administered 2020-04-20 – 2020-04-27 (×14): 60 mg via SUBCUTANEOUS
  Filled 2020-04-20 (×14): qty 0.6

## 2020-04-20 NOTE — Evaluation (Signed)
Occupational Therapy Evaluation Patient Details Name: Trevor Martinez MRN: [id number] DOB: [birth year] Today's Date: 04/20/2020    History of Present Illness 74 y.o. male with history of hypertension and hyperlipidemia was brought to the ER after patient was found to be hypoxic febrile and was having some diarrhea and feeling weak. Patient's neighbor called in for a wellness check since patient was not seen outside the house for the last 2 weeks. +COVID   Clinical Impression   PTA, pt was living alone and performing ADLs and used rollator for mobility; unsure of reliability of information. Pt currently requiring Min Guard-Min A for LB ADLs and functional transfers. Pt presenting with decreased strength, processing, and activity tolerance. Upon arrival, pt's Bryant having slipped off and SpO2 was >88% on RA. Placed pt back on 4L O2 for OOB activity. During transfer, pt SpO2 dropping to 83% and required seated rest break and cues for purse lip breathing to return to 88% on 4L. However, as pt engaged in conversation and eating breakfast, SpO2 return to 83% on 4L and then 6L. Notified RN. HR and RR stable. Pt would benefit from further acute OT to facilitate safe dc. Recommend dc to SNF for further OT to optimize safety, independence with ADLs, and return to PLOF.     Follow Up Recommendations  SNF    Equipment Recommendations  Other (comment) (Defer to next venue)    Recommendations for Other Services PT consult     Precautions / Restrictions Precautions Precautions: Fall Restrictions Weight Bearing Restrictions: No      Mobility Bed Mobility Overal bed mobility: Needs Assistance Bed Mobility: Supine to Sit     Supine to sit: Supervision     General bed mobility comments: Supervision for safety    Transfers Overall transfer level: Needs assistance Equipment used: None Transfers: Sit to/from Stand;Stand Pivot Transfers Sit to Stand: Min guard Stand pivot transfers: Min guard        General transfer comment: Min Guard A for safety    Balance Overall balance assessment: Needs assistance Sitting-balance support: No upper extremity supported;Feet supported Sitting balance-Leahy Scale: Good     Standing balance support: No upper extremity supported;During functional activity Standing balance-Leahy Scale: Fair Standing balance comment: Static standing without UE support                           ADL either performed or assessed with clinical judgement   ADL Overall ADL's : Needs assistance/impaired Eating/Feeding: Set up;Sitting Eating/Feeding Details (indicate cue type and reason): Pt eating breakfast once seated in recliner. SpO2 staying ~84-83% on 4L. Elevating pt to 6L, however, SpO2 staying at 85-84%.  Notified RN Grooming: Set up;Supervision/safety;Sitting   Upper Body Bathing: Supervision/ safety;Set up;Sitting   Lower Body Bathing: Min guard;Minimal assistance;Sit to/from stand   Upper Body Dressing : Supervision/safety;Set up;Sitting   Lower Body Dressing: Min guard;Minimal assistance;Sit to/from stand   Toilet Transfer: Min guard;Stand-pivot (simulated to Investment banker, corporate Details (indicate cue type and reason): Min Guard A for safety         Functional mobility during ADLs: Min guard (stand pivot only) General ADL Comments: Pt presenting with decreased balance, strength, and activity tolerance. Very agreeable to therapy     Vision         Perception     Praxis      Pertinent Vitals/Pain Pain Assessment: No/denies pain     Hand Dominance Right   Extremity/Trunk Assessment Upper  Extremity Assessment Upper Extremity Assessment: Overall WFL for tasks assessed   Lower Extremity Assessment Lower Extremity Assessment: Defer to PT evaluation   Cervical / Trunk Assessment Cervical / Trunk Assessment: Normal   Communication Communication Communication: HOH   Cognition Arousal/Alertness: Awake/alert Behavior  During Therapy: WFL for tasks assessed/performed Overall Cognitive Status: No family/caregiver present to determine baseline cognitive functioning Area of Impairment: Attention;Following commands                   Current Attention Level: Sustained   Following Commands: Follows one step commands with increased time       General Comments: Pt oriented to self, time, and situation. Pt requiring increased time throughout. At times, pt loosing train of thought and requiring cues to recall a question or finish a conversation.    General Comments  At rest in bed, SpO2 >88% on RA. Placing pt back on 4L O2 via [place] as it has fallen off. SpO2 dropping to 82% on 4L during transfer to recliner. HR 70-90s. RR 16-27s. Taking ~5 minutes for SpO2 to recover to 88% with seated rest break. However, during conversation and eating, pt SpO2 dropping to 84-83% on 4L and then 6L. Notified RN.     Exercises     Shoulder Instructions      Home Living Family/patient expects to be discharged to:: Private residence Living Arrangements: Alone   Type of Home: House Home Access: Level entry     Home Layout: One level     Bathroom Shower/Tub: Chief Strategy Officer: Standard     Home Equipment: Environmental consultant - 2 wheels;Walker - 4 wheels;Shower seat   Additional Comments: Unsure of accuracy      Prior Functioning/Environment Level of Independence: Independent with assistive device(s)        Comments: states he uses rollator        OT Problem List: Decreased strength;Decreased range of motion;Decreased activity tolerance;Impaired balance (sitting and/or standing);Decreased safety awareness;Decreased knowledge of precautions;Decreased knowledge of use of DME or AE;Cardiopulmonary status limiting activity      OT Treatment/Interventions: Self-care/ADL training;Therapeutic exercise;Energy conservation;DME and/or AE instruction    OT Goals(Current goals can be found in the care plan  section) Acute Rehab OT Goals Patient Stated Goal: agrees wants to get stronger OT Goal Formulation: With patient Time For Goal Achievement: 05/04/20 Potential to Achieve Goals: Good  OT Frequency: Min 2X/week   Barriers to D/C:            Co-evaluation              AM-PAC OT "6 Clicks" Daily Activity     Outcome Measure Help from another person eating meals?: A Little Help from another person taking care of personal grooming?: A Little Help from another person toileting, which includes using toliet, bedpan, or urinal?: A Little Help from another person bathing (including washing, rinsing, drying)?: A Little Help from another person to put on and taking off regular upper body clothing?: A Little Help from another person to put on and taking off regular lower body clothing?: A Little 6 Click Score: 18   End of Session Equipment Utilized During Treatment: Oxygen (4-6L) Nurse Communication: Mobility status;Other (comment) (SpO2 needs)  Activity Tolerance: Patient tolerated treatment well Patient left: in chair;with call bell/phone within reach;with chair alarm set  OT Visit Diagnosis: Unsteadiness on feet (R26.81);Other abnormalities of gait and mobility (R26.89);Muscle weakness (generalized) (M62.81)  Time: 9163-8466 OT Time Calculation (min): 18 min Charges:  OT General Charges $OT Visit: 1 Visit OT Evaluation $OT Eval Moderate Complexity: 1 Mod  Wheeler Incorvaia MSOT, OTR/L Acute Rehab Pager: [phone] Office: [phone]  Theodoro Grist Braeton Wolgamott 04/20/2020, 9:07 AM

## 2020-04-20 NOTE — Progress Notes (Signed)
Per the [facility], patient's Medicare A/B# is: [id number]. Patient is non-serviced connected, so SNF placement would go through his Medicare benefits. His next [place] appointment is 05/17/20 at 8am.  Osborne Casco Camrin Lapre LCSW

## 2020-04-20 NOTE — Care Management (Addendum)
Patient is active at [facility]. PCP Dr Irene Shipper  Fax DC note/ HH/ DME orders to [phone] Attn: Dr F.Patel, and CSW listed below with HH/ DME order. CSW Heloise Ochoa [phone] ext [phone] Cicero Duck states that she can assist w setting up [facility] through his VA benefits, HH could also be set up with his Medicare benefits without using the [place].   VA provided with SS# [id number] VA states that he also has Medicare A&B member number: [id number].

## 2020-04-20 NOTE — Progress Notes (Signed)
PROGRESS NOTE                                                                                                                                                                                                             Patient Demographics:    Trevor Martinez, is a 74 y.o. male, DOB - 06-17-1946, NBZ:[id number]  Outpatient Primary MD for the patient is No primary care provider on file.   Admit date - 04/18/2020   LOS - 2  Chief Complaint  Patient presents with  . Shortness of Breath       Brief Narrative: Patient is a 74 y.o. male with PMHx of HTN, HLD-who for the past 1 week or so-has been having cough, chest pain, diarrhea-neighbor called EMS-subsequent evaluation revealed acute hypoxic respiratory failure due to COVID-19 pneumonia  COVID-19 vaccinated status: Vaccinated  Significant Events: 11/1>> Admit to [facility] for hypoxia due to COVID-19 pneumonia  Significant studies: 10/31>>Chest x-ray: Interstitial opacities bilaterally, soft tissue fullness in the left hilar region. 11/1>> CTA chest: No PE, multifocal pneumonia, coronary artery calcifications.  No enlarged mediastinal lymph nodes.  COVID-19 medications: Steroids: 10/31>> Remdesivir: 10/31>> Baricitinib: 11/1>>  Antibiotics: Rocephin: 10/31>> Zithromax: 10/31>>  Microbiology data: 10/31 >>blood culture:No growth  Procedures: None  Consults: None  DVT prophylaxis:    Subjective:   He was sitting at bedside chair this morning-not short of breath-anywhere from 4-6 L.   Assessment  & Plan :   Acute Hypoxic Resp Failure due to Covid 19 Viral pneumonia and possible concurrent bacterial pneumonia: Appears essentially unchanged-requiring around 4-6 L of oxygen-inflammatory markers downtrending-he appears comfortable on exam.  Plans are to continue baricitinib/steroid/Remdesivir and empiric Rocephin and Zithromax.  Follow clinical course and attempt to  titrate down FiO2.  Fever: afebrile O2 requirements:  SpO2: 90 % O2 Flow Rate (L/min): 6 L/min   COVID-19 Labs: Recent Labs    04/18/20 1711 04/19/20 0441 04/20/20 0808  DDIMER 3.50* 3.21* 17.35*  FERRITIN >7,500*  --   --   LDH 670*  --   --   CRP 21.4* 21.4* 16.4*    No results found for: BNP  Recent Labs  Lab 04/18/20 1711  PROCALCITON 2.31    Lab Results  Component Value Date   SARSCOV2NAA POSITIVE (A) 04/18/2020     Prone/Incentive Spirometry: encouraged  incentive spirometry use  3-4/hour.  Significantly elevated D-dimer: Significantly elevated D-dimer today-change to therapeutic dosing of Lovenox-CTA chest on 11/1 --we will not obtain a lower extremity Doppler.  Hypoxia remains essentially unchanged compared to yesterday.  Transaminitis: Mild-likely secondary to COVID-19-stable for follow-up.  HTN: Continue lisinopril-hold HCTZ-follow and adjust.  HLD: Continue statin-watch LFTs.   ABG: No results found for: PHART, PCO2ART, PO2ART, HCO3, TCO2, ACIDBASEDEF, O2SAT  Vent Settings: N/A   Condition -Guarded  Family Communication  : Patient is estranged from his son-claims he has a sister but does not know her number-I have asked social work for assistance in contacting his family.  Code Status :  Full Code  Diet :  Diet Order            Diet Heart Room service appropriate? Yes; Fluid consistency: Thin  Diet effective now                  Disposition Plan  :   Status is: Inpatient  Remains inpatient appropriate because:Inpatient level of care appropriate due to severity of illness   Dispo: The patient is from: Home              Anticipated d/c is to: Home              Anticipated d/c date is: > 3 days              Patient currently is not medically stable to d/c.    Barriers to discharge: Hypoxia requiring O2 supplementation/complete 5 days of IV Remdesivir  Antimicorbials  :    Anti-infectives (From admission, onward)   Start      Dose/Rate Route Frequency Ordered Stop   04/19/20 2200  cefTRIAXone (ROCEPHIN) 2 g in sodium chloride 0.9 % 100 mL IVPB        2 g 200 mL/hr over 30 Minutes Intravenous Every 24 hours 04/18/20 2220     04/19/20 1000  remdesivir 100 mg in sodium chloride 0.9 % 100 mL IVPB       "Followed by" Linked Group Details   100 mg 200 mL/hr over 30 Minutes Intravenous Daily 04/18/20 2137 04/23/20 0959   04/19/20 0000  azithromycin (ZITHROMAX) 500 mg in sodium chloride 0.9 % 250 mL IVPB        500 mg 250 mL/hr over 60 Minutes Intravenous Every 24 hours 04/18/20 2220     04/18/20 2300  remdesivir 200 mg in sodium chloride 0.9% 250 mL IVPB       "Followed by" Linked Group Details   200 mg 580 mL/hr over 30 Minutes Intravenous Once 04/18/20 2137 04/19/20 0003   04/18/20 2300  cefTRIAXone (ROCEPHIN) 2 g in sodium chloride 0.9 % 100 mL IVPB        2 g 200 mL/hr over 30 Minutes Intravenous  Once 04/18/20 2258 04/19/20 0001      Inpatient Medications  Scheduled Meds: . vitamin C  500 mg Oral Daily  . baricitinib  4 mg Oral Daily  . benzonatate  200 mg Oral TID  . enoxaparin (LOVENOX) injection  60 mg Subcutaneous Q12H  . lisinopril  10 mg Oral Daily  . methylPREDNISolone (SOLU-MEDROL) injection  50 mg Intravenous Q12H  . pravastatin  40 mg Oral Q supper  . zinc sulfate  220 mg Oral Daily   Continuous Infusions: . azithromycin 500 mg (04/20/20 0058)  . cefTRIAXone (ROCEPHIN)  IV Stopped (04/19/20 2200)  . remdesivir 100 mg in NS 100 mL Stopped (04/20/20 0925)  PRN Meds:.acetaminophen **OR** acetaminophen, albuterol   Time Spent in minutes  35   See all Orders from today for further details   Jeoffrey MassedShanker Iram Astorino M.D on 04/20/2020 at 1:11 PM  To page go to [email or web address] - use universal password  [facility] -  Office  [phone]    Objective:   Vitals:   04/19/20 1244 04/19/20 2000 04/20/20 0407 04/20/20 0800  BP: 121/76 109/64 108/68 107/73  Pulse: 88 78 79 78  Resp: 19 20  20 19   Temp: 97.8 F (36.6 C) 97.9 F (36.6 C) 98.1 F (36.7 C)   TempSrc: Oral Oral Oral   SpO2: 97% 90% 90% 90%  Weight:      Height:        Wt Readings from Last 3 Encounters:  04/18/20 61.2 kg     Intake/Output Summary (Last 24 hours) at 04/20/2020 1311 Last data filed at 04/19/2020 2317 Gross per 24 hour  Intake 373.89 ml  Output 2200 ml  Net -1826.11 ml     Physical Exam Gen Exam:Alert awake-not in any distress HEENT:atraumatic, normocephalic Chest: B/L clear to auscultation anteriorly CVS:S1S2 regular Abdomen:soft non tender, non distended Extremities:no edema Neurology: Non focal Skin: no rash   Data Review:    CBC Recent Labs  Lab 04/18/20 1711 04/19/20 0441 04/20/20 0808  WBC 4.9 4.3 7.0  HGB 14.6 13.1 12.8*  HCT 43.9 38.1* 37.6*  PLT 151 130* 194  MCV 81.8 81.2 81.2  MCH 27.2 27.9 27.6  MCHC 33.3 34.4 34.0  RDW 12.6 12.8 13.0  LYMPHSABS 0.3* 0.2* 0.7  MONOABS 0.1 0.1 0.2  EOSABS 0.0 0.0 0.0  BASOSABS 0.0 0.0 0.0    Chemistries  Recent Labs  Lab 04/18/20 1711 04/19/20 0441 04/20/20 0808  NA 140 138 140  K 4.0 4.2 3.9  CL 104 106 107  CO2 24 20* 20*  GLUCOSE 140* 257* 195*  BUN 30* 28* 36*  CREATININE 1.17 1.06 0.95  CALCIUM 8.4* 7.9* 8.6*  AST 61* 47* 36  ALT 36 30 27  ALKPHOS 28* 25* 29*  BILITOT 1.7* 0.8 0.7   ------------------------------------------------------------------------------------------------------------------ Recent Labs    04/18/20 1711  TRIG 170*    No results found for: HGBA1C ------------------------------------------------------------------------------------------------------------------ No results for input(s): TSH, T4TOTAL, T3FREE, THYROIDAB in the last 72 hours.  Invalid input(s): FREET3 ------------------------------------------------------------------------------------------------------------------ Recent Labs    04/18/20 1711  FERRITIN >7,500*    Coagulation profile No results for  input(s): INR, PROTIME in the last 168 hours.  Recent Labs    04/19/20 0441 04/20/20 0808  DDIMER 3.21* 17.35*    Cardiac Enzymes No results for input(s): CKMB, TROPONINI, MYOGLOBIN in the last 168 hours.  Invalid input(s): CK ------------------------------------------------------------------------------------------------------------------ No results found for: BNP  Micro Results Recent Results (from the past 240 hour(s))  Respiratory Panel by RT PCR (Flu A&B, Covid) - Nasopharyngeal Swab     Status: Abnormal   Collection Time: 04/18/20  6:09 PM   Specimen: Nasopharyngeal Swab  Result Value Ref Range Status   SARS Coronavirus 2 by RT PCR POSITIVE (A) NEGATIVE Final    Comment: RESULT CALLED TO, READ BACK BY AND VERIFIED WITH: DR D RAY @2029  04/18/20 BY S GEZAHEGN (NOTE) SARS-CoV-2 target nucleic acids are DETECTED.  SARS-CoV-2 RNA is generally detectable in upper respiratory specimens  during the acute phase of infection. Positive results are indicative of the presence of the identified virus, but do not rule out bacterial infection or co-infection with other pathogens not detected by the  test. Clinical correlation with patient history and other diagnostic information is necessary to determine patient infection status. The expected result is Negative.  Fact Sheet for Patients:  https://www.moore.com/  Fact Sheet for Healthcare Providers: https://www.young.biz/  This test is not yet approved or cleared by the [place] FDA and  has been authorized for detection and/or diagnosis of SARS-CoV-2 by FDA under an Emergency Use Authorization (EUA).  This EUA will remain in effect (meaning this test can  be used) for the duration of  the COVID-19 declaration under Section 564(b)(1) of the Act, 21 U.S.C. section 360bbb-3(b)(1), unless the authorization is terminated or revoked sooner.      Influenza A by PCR NEGATIVE NEGATIVE Final    Influenza B by PCR NEGATIVE NEGATIVE Final    Comment: (NOTE) The Xpert Xpress SARS-CoV-2/FLU/RSV assay is intended as an aid in  the diagnosis of influenza from Nasopharyngeal swab specimens and  should not be used as a sole basis for treatment. Nasal washings and  aspirates are unacceptable for Xpert Xpress SARS-CoV-2/FLU/RSV  testing.  Fact Sheet for Patients: https://www.moore.com/  Fact Sheet for Healthcare Providers: https://www.young.biz/  This test is not yet approved or cleared by the [place] FDA and  has been authorized for detection and/or diagnosis of SARS-CoV-2 by  FDA under an Emergency Use Authorization (EUA). This EUA will remain  in effect (meaning this test can be used) for the duration of the  Covid-19 declaration under Section 564(b)(1) of the Act, 21  U.S.C. section 360bbb-3(b)(1), unless the authorization is  terminated or revoked. Performed at [facility], [address], [place], [place] [address]   Blood Culture (routine x 2)     Status: None (Preliminary result)   Collection Time: 04/18/20  6:09 PM   Specimen: BLOOD  Result Value Ref Range Status   Specimen Description BLOOD RIGHT ANTECUBITAL  Final   Special Requests   Final    BOTTLES DRAWN AEROBIC AND ANAEROBIC Blood Culture adequate volume   Culture   Final    NO GROWTH 2 DAYS Performed at [facility], [address], [place], [place] [address]    Report Status PENDING  Incomplete  Blood Culture (routine x 2)     Status: None (Preliminary result)   Collection Time: 04/19/20  4:41 AM   Specimen: BLOOD  Result Value Ref Range Status   Specimen Description BLOOD LEFT ANTECUBITAL  Final   Special Requests   Final    BOTTLES DRAWN AEROBIC AND ANAEROBIC Blood Culture results may not be optimal due to an excessive volume of blood received in culture bottles   Culture   Final    NO GROWTH 1 DAY Performed at [facility], [address], [place], [place] [address]    Report Status PENDING  Incomplete    Radiology Reports CT ANGIO CHEST PE W OR WO CONTRAST  Result Date: 04/19/2020 CLINICAL DATA:  Hypoxic, fever.  COVID-19 positive. EXAM: CT ANGIOGRAPHY CHEST WITH CONTRAST TECHNIQUE: Multidetector CT imaging of the chest was performed using the standard protocol during bolus administration of intravenous contrast. Multiplanar CT image reconstructions and MIPs were obtained to evaluate the vascular anatomy. CONTRAST:  75mL OMNIPAQUE IOHEXOL 350 MG/ML SOLN COMPARISON:  None. FINDINGS: Cardiovascular: Satisfactory opacification of the pulmonary arteries to the segmental level. No evidence of pulmonary embolism. Normal heart size. No pericardial effusion. Mild coronary artery calcifications are noted. Mediastinum/Nodes: No enlarged mediastinal, hilar, or axillary lymph nodes. Thyroid gland, trachea,  and esophagus demonstrate no significant findings. Lungs/Pleura: No pneumothorax or pleural effusion is noted. Bilateral multiple airspace opacities are noted consistent with multifocal pneumonia due to COVID-19. Upper Abdomen: No acute abnormality. Musculoskeletal: No chest wall abnormality. No acute or significant osseous findings. Review of the MIP images confirms the above findings. IMPRESSION: 1. No definite evidence of pulmonary embolus. 2. Mild coronary artery calcifications are noted suggesting coronary artery disease. 3. Bilateral multiple airspace opacities are noted consistent with multifocal pneumonia due to COVID-19. Electronically Signed   By: Lupita Raider M.D.   On: 04/19/2020 09:55   DG Chest Port 1 View  Result Date: 04/18/2020 CLINICAL DATA:  Shortness of breath. EXAM: PORTABLE CHEST 1 VIEW COMPARISON:  None. FINDINGS: The heart is normal in size. There is soft tissue fullness in the left hilar region. Diffuse fine interstitial opacities, left greater than right and most prominent in the lower lung zones. There is slightly  more patchy opacity at the left lung base. No pneumothorax or large pleural effusion. Biapical pleuroparenchymal scarring. No acute osseous abnormalities are seen. IMPRESSION: 1. Diffuse fine interstitial opacities, left greater than right and most prominent in the lower lung zones. Differential considerations favor infection, less likely pulmonary edema, neoplasm, or interstitial lung disease. 2. Soft tissue fullness in the left hilar region. Suspect underlying adenopathy. 3. Recommend clinical correlation. Radiographic follow-up is recommended after course of treatment. Should hilar fullness persist, recommend further characterization with chest CT. Electronically Signed   By: Narda Rutherford M.D.   On: 04/18/2020 18:40

## 2020-04-20 NOTE — Progress Notes (Signed)
Physical Therapy Treatment Patient Details Name: Trevor Martinez MRN: [id number] DOB: Nov 05, 1945 Today's Date: 04/20/2020    History of Present Illness 74 y.o. male with history of hypertension and hyperlipidemia was brought to the ER after patient was found to be hypoxic febrile and was having some diarrhea and feeling weak. Patient's neighbor called in for a wellness check since patient was not seen outside the house for the last 2 weeks. +COVID    PT Comments    Patient agreeable to using RW today, however mid-session reminded PT that he has a rollator at home which he much prefers. During ambulation and immediately afterward, pt's sats better compared to 11/01. However each time began to initiate exercises, pt began coughing and would required several minutes to recover. During recovery worked on pursed lip breathing as pt does not naturally resort to this technique.    Supine with HOB 20 degrees: 4L sats 90% HR 84 -Based on earlier O2 needs with OT, incr to 6L for ambulation with lowest sats 86% HR 90 -6L seated rest with sats down to 85%; after 3 min 90% HR 94 -decr to 4L +coughing with sats 85-86%; up to 88% at end of session    Follow Up Recommendations  SNF;Supervision/Assistance - 24 hour     Equipment Recommendations  Other (comment) (TBD next venue)    Recommendations for Other Services       Precautions / Restrictions Precautions Precautions: Fall    Mobility  Bed Mobility Overal bed mobility: Modified Independent Bed Mobility: Supine to Sit     Supine to sit: Modified independent (Device/Increase time)     General bed mobility comments: incr time, moves slowly  Transfers Overall transfer level: Needs assistance Equipment used: Rolling walker (2 wheeled) Transfers: Sit to/from Stand Sit to Stand: Min guard         General transfer comment: vc for safe use of RW; no imbalance  Ambulation/Gait Ambulation/Gait assistance: Min Chemical engineer  (Feet): 22 Feet Assistive device: Rolling walker (2 wheeled) Gait Pattern/deviations: Decreased stride length;Narrow base of support;Step-to pattern;Trunk flexed Gait velocity: decr   General Gait Details: pt accustomed to using a rollator and required vc for use of RW; He continued step-to pattern throughout and required assist to [employer] Mobility    Modified Rankin (Stroke Patients Only)       Balance Overall balance assessment: Needs assistance Sitting-balance support: No upper extremity supported;Feet supported Sitting balance-Leahy Scale: Good     Standing balance support: Bilateral upper extremity supported Standing balance-Leahy Scale: Poor Standing balance comment: needed external support                            Cognition Arousal/Alertness: Awake/alert Behavior During Therapy: WFL for tasks assessed/performed Overall Cognitive Status: No family/caregiver present to determine baseline cognitive functioning Area of Impairment: Following commands                       Following Commands: Follows one step commands with increased time       General Comments: feel delayed responses due to decr hearing. Knew today was election day and date.      Exercises Other Exercises Other Exercises: deferred due to continued coughing spells and incr recovery time    General Comments        Pertinent Vitals/Pain Pain  Assessment: No/denies pain    Home Living                      Prior Function            PT Goals (current goals can now be found in the care plan section) Acute Rehab PT Goals Patient Stated Goal: agrees wants to get stronger Time For Goal Achievement: 05/03/20 Potential to Achieve Goals: Good Progress towards PT goals: Progressing toward goals    Frequency    Min 2X/week      PT Plan Current plan remains appropriate    Co-evaluation              AM-PAC  PT "6 Clicks" Mobility   Outcome Measure  Help needed turning from your back to your side while in a flat bed without using bedrails?: None Help needed moving from lying on your back to sitting on the side of a flat bed without using bedrails?: None Help needed moving to and from a bed to a chair (including a wheelchair)?: A Little Help needed standing up from a chair using your arms (e.g., wheelchair or bedside chair)?: A Little Help needed to walk in hospital room?: A Little Help needed climbing 3-5 steps with a railing? : A Little 6 Click Score: 20    End of Session Equipment Utilized During Treatment: Gait belt;Oxygen Activity Tolerance: Patient tolerated treatment well Patient left: in chair;with call bell/phone within reach;with chair alarm set   PT Visit Diagnosis: Unsteadiness on feet (R26.81);Muscle weakness (generalized) (M62.81)     Time: 1540-0867 PT Time Calculation (min) (ACUTE ONLY): 25 min  Charges:  $Gait Training: 8-22 mins $Self Care/Home Management: 8-22                      Trevor Martinez, PT Pager [phone]    Trevor Martinez 04/20/2020, 4:53 PM

## 2020-04-20 NOTE — Progress Notes (Signed)
Bilateral lower extremity venous duplex completed. Refer to "CV Proc" under chart review to view preliminary results.  04/20/2020 3:47 PM Eula Fried., MHA, RVT, RDCS, RDMS

## 2020-04-21 LAB — CBC WITH DIFFERENTIAL/PLATELET
Abs Immature Granulocytes: 0 10*3/uL (ref 0.00–0.07)
Basophils Absolute: 0 10*3/uL (ref 0.0–0.1)
Basophils Relative: 0 %
Eosinophils Absolute: 0 10*3/uL (ref 0.0–0.5)
Eosinophils Relative: 0 %
HCT: 36.5 % — ABNORMAL LOW (ref 39.0–52.0)
Hemoglobin: 12.4 g/dL — ABNORMAL LOW (ref 13.0–17.0)
Lymphocytes Relative: 2 %
Lymphs Abs: 0.2 10*3/uL — ABNORMAL LOW (ref 0.7–4.0)
MCH: 27.7 pg (ref 26.0–34.0)
MCHC: 34 g/dL (ref 30.0–36.0)
MCV: 81.7 fL (ref 80.0–100.0)
Monocytes Absolute: 0.2 10*3/uL (ref 0.1–1.0)
Monocytes Relative: 3 %
Neutro Abs: 7.6 10*3/uL (ref 1.7–7.7)
Neutrophils Relative %: 95 %
Platelets: 182 10*3/uL (ref 150–400)
RBC: 4.47 MIL/uL (ref 4.22–5.81)
RDW: 13 % (ref 11.5–15.5)
WBC: 8 10*3/uL (ref 4.0–10.5)
nRBC: 0 % (ref 0.0–0.2)
nRBC: 0 /100 WBC

## 2020-04-21 LAB — C-REACTIVE PROTEIN: CRP: 9.6 mg/dL — ABNORMAL HIGH (ref <1.0)

## 2020-04-21 LAB — COMPREHENSIVE METABOLIC PANEL
ALT: 25 U/L (ref 0–44)
AST: 37 U/L (ref 15–41)
Albumin: 2.3 g/dL — ABNORMAL LOW (ref 3.5–5.0)
Alkaline Phosphatase: 38 U/L (ref 38–126)
Anion gap: 14 (ref 5–15)
BUN: 36 mg/dL — ABNORMAL HIGH (ref 8–23)
CO2: 20 mmol/L — ABNORMAL LOW (ref 22–32)
Calcium: 8.6 mg/dL — ABNORMAL LOW (ref 8.9–10.3)
Chloride: 106 mmol/L (ref 98–111)
Creatinine, Ser: 0.9 mg/dL (ref 0.61–1.24)
GFR, Estimated: >60 mL/min (ref >60)
Glucose, Bld: 215 mg/dL — ABNORMAL HIGH (ref 70–99)
Potassium: 4 mmol/L (ref 3.5–5.1)
Sodium: 140 mmol/L (ref 135–145)
Total Bilirubin: 0.6 mg/dL (ref 0.3–1.2)
Total Protein: 5.6 g/dL — ABNORMAL LOW (ref 6.5–8.1)

## 2020-04-21 LAB — GLUCOSE, CAPILLARY
Glucose-Capillary: 184 mg/dL — ABNORMAL HIGH (ref 70–99)
Glucose-Capillary: 207 mg/dL — ABNORMAL HIGH (ref 70–99)

## 2020-04-21 LAB — D-DIMER, QUANTITATIVE: D-Dimer, Quant: >20 ug/mL-FEU — ABNORMAL HIGH (ref 0.00–0.50)

## 2020-04-21 LAB — PROCALCITONIN: Procalcitonin: 1.34 ng/mL

## 2020-04-21 MED ORDER — INSULIN ASPART 100 UNIT/ML ~~LOC~~ SOLN
0.0000 [IU] | Freq: Three times a day (TID) | SUBCUTANEOUS | Status: DC
  Administered 2020-04-21: 2 [IU] via SUBCUTANEOUS
  Administered 2020-04-22: 5 [IU] via SUBCUTANEOUS
  Administered 2020-04-22: 2 [IU] via SUBCUTANEOUS
  Administered 2020-04-22: 3 [IU] via SUBCUTANEOUS
  Administered 2020-04-23 (×3): 2 [IU] via SUBCUTANEOUS
  Administered 2020-04-24: 7 [IU] via SUBCUTANEOUS
  Administered 2020-04-24: 2 [IU] via SUBCUTANEOUS
  Administered 2020-04-24 – 2020-04-26 (×2): 3 [IU] via SUBCUTANEOUS
  Administered 2020-04-26: 2 [IU] via SUBCUTANEOUS
  Administered 2020-04-27 (×2): 1 [IU] via SUBCUTANEOUS

## 2020-04-21 NOTE — Progress Notes (Signed)
2:30pm-Patient not answering room phone. CSW requested assistance from RN.   4:45pm-Patient answered phone but is unable to hear CSW/operate phone. Will attempt again tomorrow for SNF workup.  Makisha Marrin LCSW

## 2020-04-21 NOTE — Progress Notes (Signed)
PROGRESS NOTE                                                                                                                                                                                                             Patient Demographics:    Trevor Martinez, is a 74 y.o. male, DOB - Mar 30, 1946, ZOX:[id number]  Outpatient Primary MD for the patient is Patient, No Pcp Per   Admit date - 04/18/2020   LOS - 3  Chief Complaint  Patient presents with  . Shortness of Breath       Brief Narrative: Patient is a 74 y.o. male with PMHx of HTN, HLD-who for the past 1 week or so-has been having cough, chest pain, diarrhea-neighbor called EMS-subsequent evaluation revealed acute hypoxic respiratory failure due to COVID-19 pneumonia  COVID-19 vaccinated status: Vaccinated  Significant Events: 11/1>> Admit to [facility] for hypoxia due to COVID-19 pneumonia  Significant studies: 10/31>>Chest x-ray: Interstitial opacities bilaterally, soft tissue fullness in the left hilar region. 11/1>> CTA chest: No PE, multifocal pneumonia, coronary artery calcifications.  No enlarged mediastinal lymph nodes.  COVID-19 medications: Steroids: 10/31>> Remdesivir: 10/31>> Baricitinib: 11/1>>  Antibiotics: Rocephin: 10/31>> Zithromax: 10/31>>11/2  Microbiology data: 10/31 >>blood culture:No growth  Procedures: None  Consults: None  DVT prophylaxis: Lovenox at therapeutic dosing   Subjective:   Was sitting at bedside chair when I saw him earlier-he had his oxygen off-and was satting around 88 to 91%.  I put him on 2 L of oxygen and his O2 saturation was consistently in the low 90s.   Assessment  & Plan :   Acute Hypoxic Resp Failure due to Covid 19 Viral pneumonia and possible concurrent bacterial pneumonia: Seems to have improved somewhat-Down to 2 L of oxygen at rest-continue steroid/Remdesivir/baricitinib-remains on Rocephin-completed a  course of Zithromax on 11/2.  Follow clinical course-continue to titrate down FiO2 as tolerated.    Fever: afebrile O2 requirements:  SpO2: 91 % O2 Flow Rate (L/min): 4 L/min   COVID-19 Labs: Recent Labs    04/18/20 1711 04/18/20 1711 04/19/20 0441 04/20/20 0808 04/21/20 0025  DDIMER 3.50*   < > 3.21* 17.35* >20.00*  FERRITIN >7,500*  --   --   --   --   LDH 670*  --   --   --   --   CRP 21.4*   < >  21.4* 16.4* 9.6*   < > = values in this interval not displayed.    No results found for: BNP  Recent Labs  Lab 04/18/20 1711 04/21/20 0025  PROCALCITON 2.31 1.34    Lab Results  Component Value Date   SARSCOV2NAA POSITIVE (A) 04/18/2020     Prone/Incentive Spirometry: encouraged  incentive spirometry use 3-4/hour.  Significantly elevated D-dimer: D-dimer continues to be significantly elevated-Doppler/CTA chest negative for VTE-continue therapeutic dosing of Lovenox until D-dimer starts trending down.  Steroid-induced hyperglycemia: Start SSI-check A1c with a.m. labs.  Transaminitis: Mild-likely secondary to COVID-19-stable for follow-up.  HTN: Continue lisinopril-hold HCTZ-follow and adjust.  HLD: Continue statin-watch LFTs.   ABG: No results found for: PHART, PCO2ART, PO2ART, HCO3, TCO2, ACIDBASEDEF, O2SAT  Vent Settings: N/A   Condition -Guarded  Family Communication  : Patient is estranged from his son-claims he has a sister but does not know her number-I have asked social work for assistance in contacting his family.  Code Status :  Full Code  Diet :  Diet Order            Diet Heart Room service appropriate? Yes; Fluid consistency: Thin  Diet effective now                  Disposition Plan  :   Status is: Inpatient  Remains inpatient appropriate because:Inpatient level of care appropriate due to severity of illness   Dispo: The patient is from: Home              Anticipated d/c is to: Home              Anticipated d/c date is: > 3 days               Patient currently is not medically stable to d/c.    Barriers to discharge: Hypoxia requiring O2 supplementation/complete 5 days of IV Remdesivir  Antimicorbials  :    Anti-infectives (From admission, onward)   Start     Dose/Rate Route Frequency Ordered Stop   04/19/20 2200  cefTRIAXone (ROCEPHIN) 2 g in sodium chloride 0.9 % 100 mL IVPB        2 g 200 mL/hr over 30 Minutes Intravenous Every 24 hours 04/18/20 2220 04/24/20 2159   04/19/20 1000  remdesivir 100 mg in sodium chloride 0.9 % 100 mL IVPB       "Followed by" Linked Group Details   100 mg 200 mL/hr over 30 Minutes Intravenous Daily 04/18/20 2137 04/23/20 0959   04/19/20 0000  azithromycin (ZITHROMAX) 500 mg in sodium chloride 0.9 % 250 mL IVPB        500 mg 250 mL/hr over 60 Minutes Intravenous Every 24 hours 04/18/20 2220 04/21/20 0200   04/18/20 2300  remdesivir 200 mg in sodium chloride 0.9% 250 mL IVPB       "Followed by" Linked Group Details   200 mg 580 mL/hr over 30 Minutes Intravenous Once 04/18/20 2137 04/19/20 0003   04/18/20 2300  cefTRIAXone (ROCEPHIN) 2 g in sodium chloride 0.9 % 100 mL IVPB        2 g 200 mL/hr over 30 Minutes Intravenous  Once 04/18/20 2258 04/19/20 0001      Inpatient Medications  Scheduled Meds: . vitamin C  500 mg Oral Daily  . baricitinib  4 mg Oral Daily  . benzonatate  200 mg Oral TID  . enoxaparin (LOVENOX) injection  60 mg Subcutaneous Q12H  . lisinopril  10 mg Oral  Daily  . methylPREDNISolone (SOLU-MEDROL) injection  50 mg Intravenous Q12H  . pravastatin  40 mg Oral Q supper  . zinc sulfate  220 mg Oral Daily   Continuous Infusions: . cefTRIAXone (ROCEPHIN)  IV Stopped (04/20/20 2200)  . remdesivir 100 mg in NS 100 mL 100 mg (04/21/20 1210)   PRN Meds:.acetaminophen **OR** acetaminophen, albuterol   Time Spent in minutes  25   See all Orders from today for further details   Jeoffrey Massed M.D on 04/21/2020 at 1:53 PM  To page go to [email or web address] - use  universal password  [facility] -  Office  [phone]    Objective:   Vitals:   04/20/20 2055 04/21/20 0541 04/21/20 0800 04/21/20 1233  BP: 125/76 (!) 141/75  (!) 161/79  Pulse: 78 83  95  Resp: 20 (!) 24  20  Temp: 98.2 F (36.8 C) 98.3 F (36.8 C) 98.2 F (36.8 C) 97.8 F (36.6 C)  TempSrc: Axillary Axillary  Oral  SpO2: 98% 92%  91%  Weight:      Height:        Wt Readings from Last 3 Encounters:  04/18/20 61.2 kg     Intake/Output Summary (Last 24 hours) at 04/21/2020 1353 Last data filed at 04/20/2020 1500 Gross per 24 hour  Intake 100 ml  Output --  Net 100 ml     Physical Exam Gen Exam:Alert awake-not in any distress HEENT:atraumatic, normocephalic Chest: B/L clear to auscultation anteriorly CVS:S1S2 regular Abdomen:soft non tender, non distended Extremities:no edema Neurology: Non focal Skin: no rash   Data Review:    CBC Recent Labs  Lab 04/18/20 1711 04/19/20 0441 04/20/20 0808 04/21/20 0025  WBC 4.9 4.3 7.0 8.0  HGB 14.6 13.1 12.8* 12.4*  HCT 43.9 38.1* 37.6* 36.5*  PLT 151 130* 194 182  MCV 81.8 81.2 81.2 81.7  MCH 27.2 27.9 27.6 27.7  MCHC 33.3 34.4 34.0 34.0  RDW 12.6 12.8 13.0 13.0  LYMPHSABS 0.3* 0.2* 0.7 0.2*  MONOABS 0.1 0.1 0.2 0.2  EOSABS 0.0 0.0 0.0 0.0  BASOSABS 0.0 0.0 0.0 0.0    Chemistries  Recent Labs  Lab 04/18/20 1711 04/19/20 0441 04/20/20 0808 04/21/20 0025  NA 140 138 140 140  K 4.0 4.2 3.9 4.0  CL 104 106 107 106  CO2 24 20* 20* 20*  GLUCOSE 140* 257* 195* 215*  BUN 30* 28* 36* 36*  CREATININE 1.17 1.06 0.95 0.90  CALCIUM 8.4* 7.9* 8.6* 8.6*  AST 61* 47* 36 37  ALT 36 ALKPHOS 28* 25* 29* 38  BILITOT 1.7* 0.8 0.7 0.6   ------------------------------------------------------------------------------------------------------------------ Recent Labs    04/18/20 1711  TRIG 170*    No results found for:  HGBA1C ------------------------------------------------------------------------------------------------------------------ No results for input(s): TSH, T4TOTAL, T3FREE, THYROIDAB in the last 72 hours.  Invalid input(s): FREET3 ------------------------------------------------------------------------------------------------------------------ Recent Labs    04/18/20 1711  FERRITIN >7,500*    Coagulation profile No results for input(s): INR, PROTIME in the last 168 hours.  Recent Labs    04/20/20 0808 04/21/20 0025  DDIMER 17.35* >20.00*    Cardiac Enzymes No results for input(s): CKMB, TROPONINI, MYOGLOBIN in the last 168 hours.  Invalid input(s): CK ------------------------------------------------------------------------------------------------------------------ No results found for: BNP  Micro Results Recent Results (from the past 240 hour(s))  Respiratory Panel by RT PCR (Flu A&B, Covid) - Nasopharyngeal Swab     Status: Abnormal   Collection Time: 04/18/20  6:09 PM   Specimen: Nasopharyngeal Swab  Result Value Ref Range Status   SARS Coronavirus 2 by RT PCR POSITIVE (A) NEGATIVE Final    Comment: RESULT CALLED TO, READ BACK BY AND VERIFIED WITH: DR D RAY @2029  04/18/20 BY S GEZAHEGN (NOTE) SARS-CoV-2 target nucleic acids are DETECTED.  SARS-CoV-2 RNA is generally detectable in upper respiratory specimens  during the acute phase of infection. Positive results are indicative of the presence of the identified virus, but do not rule out bacterial infection or co-infection with other pathogens not detected by the test. Clinical correlation with patient history and other diagnostic information is necessary to determine patient infection status. The expected result is Negative.  Fact Sheet for Patients:  04/20/20  Fact Sheet for Healthcare Providers: https://www.moore.com/  This test is not yet approved or cleared by  the https://www.young.biz/ FDA and  has been authorized for detection and/or diagnosis of SARS-CoV-2 by FDA under an Emergency Use Authorization (EUA).  This EUA will remain in effect (meaning this test can  be used) for the duration of  the COVID-19 declaration under Section 564(b)(1) of the Act, 21 U.S.C. section 360bbb-3(b)(1), unless the authorization is terminated or revoked sooner.      Influenza A by PCR NEGATIVE NEGATIVE Final   Influenza B by PCR NEGATIVE NEGATIVE Final    Comment: (NOTE) The Xpert Xpress SARS-CoV-2/FLU/RSV assay is intended as an aid in  the diagnosis of influenza from Nasopharyngeal swab specimens and  should not be used as a sole basis for treatment. Nasal washings and  aspirates are unacceptable for Xpert Xpress SARS-CoV-2/FLU/RSV  testing.  Fact Sheet for Patients: [place]  Fact Sheet for Healthcare Providers: https://www.moore.com/  This test is not yet approved or cleared by the https://www.young.biz/ FDA and  has been authorized for detection and/or diagnosis of SARS-CoV-2 by  FDA under an Emergency Use Authorization (EUA). This EUA will remain  in effect (meaning this test can be used) for the duration of the  Covid-19 declaration under Section 564(b)(1) of the Act, 21  U.S.C. section 360bbb-3(b)(1), unless the authorization is  terminated or revoked. Performed at [facility], [address], [place], [place]   Blood Culture (routine x 2)     Status: None (Preliminary result)   Collection Time: 04/18/20  6:09 PM   Specimen: BLOOD  Result Value Ref Range Status   Specimen Description BLOOD RIGHT ANTECUBITAL  Final   Special Requests   Final    BOTTLES DRAWN AEROBIC AND ANAEROBIC Blood Culture adequate volume   Culture   Final    NO GROWTH 3 DAYS Performed at [facility], [address], [place], [place]    Report Status PENDING  Incomplete  Blood Culture (routine x 2)      Status: None (Preliminary result)   Collection Time: 04/19/20  4:41 AM   Specimen: BLOOD  Result Value Ref Range Status   Specimen Description BLOOD LEFT ANTECUBITAL  Final   Special Requests   Final    BOTTLES DRAWN AEROBIC AND ANAEROBIC Blood Culture results may not be optimal due to an excessive volume of blood received in culture bottles   Culture   Final    NO GROWTH 2 DAYS Performed at [facility], [address], [place], [place]    Report Status PENDING  Incomplete    Radiology Reports CT ANGIO CHEST PE W OR WO CONTRAST  Result Date: 04/19/2020 CLINICAL DATA:  Hypoxic, fever.  COVID-19 positive. EXAM:  CT ANGIOGRAPHY CHEST WITH CONTRAST TECHNIQUE: Multidetector CT imaging of the chest was performed using the standard protocol during bolus administration of intravenous contrast. Multiplanar CT image reconstructions and MIPs were obtained to evaluate the vascular anatomy. CONTRAST:  75mL OMNIPAQUE IOHEXOL 350 MG/ML SOLN COMPARISON:  None. FINDINGS: Cardiovascular: Satisfactory opacification of the pulmonary arteries to the segmental level. No evidence of pulmonary embolism. Normal heart size. No pericardial effusion. Mild coronary artery calcifications are noted. Mediastinum/Nodes: No enlarged mediastinal, hilar, or axillary lymph nodes. Thyroid gland, trachea, and esophagus demonstrate no significant findings. Lungs/Pleura: No pneumothorax or pleural effusion is noted. Bilateral multiple airspace opacities are noted consistent with multifocal pneumonia due to COVID-19. Upper Abdomen: No acute abnormality. Musculoskeletal: No chest wall abnormality. No acute or significant osseous findings. Review of the MIP images confirms the above findings. IMPRESSION: 1. No definite evidence of pulmonary embolus. 2. Mild coronary artery calcifications are noted suggesting coronary artery disease. 3. Bilateral multiple airspace opacities are noted consistent with multifocal pneumonia due  to COVID-19. Electronically Signed   By: Lupita RaiderJames  Green Jr M.D.   On: 04/19/2020 09:55   DG Chest Port 1 View  Result Date: 04/18/2020 CLINICAL DATA:  Shortness of breath. EXAM: PORTABLE CHEST 1 VIEW COMPARISON:  None. FINDINGS: The heart is normal in size. There is soft tissue fullness in the left hilar region. Diffuse fine interstitial opacities, left greater than right and most prominent in the lower lung zones. There is slightly more patchy opacity at the left lung base. No pneumothorax or large pleural effusion. Biapical pleuroparenchymal scarring. No acute osseous abnormalities are seen. IMPRESSION: 1. Diffuse fine interstitial opacities, left greater than right and most prominent in the lower lung zones. Differential considerations favor infection, less likely pulmonary edema, neoplasm, or interstitial lung disease. 2. Soft tissue fullness in the left hilar region. Suspect underlying adenopathy. 3. Recommend clinical correlation. Radiographic follow-up is recommended after course of treatment. Should hilar fullness persist, recommend further characterization with chest CT. Electronically Signed   By: Narda RutherfordMelanie  Sanford M.D.   On: 04/18/2020 18:40   VAS US LOWER EXTREMITY VENOUS (DVT)  Result Date: 04/20/2020  Lower Venous DVT Study Indications: Elevated d-dimer= 17.35.  Comparison Study: No prior study Performing Technologist: Gertie FeyMichelle Simonetti MHA, RDMS, RVT, RDCS  Examination Guidelines: A complete evaluation includes B-mode imaging, spectral Doppler, color Doppler, and power Doppler as needed of all accessible portions of each vessel. Bilateral testing is considered an integral part of a complete examination. Limited examinations for reoccurring indications may be performed as noted. The reflux portion of the exam is performed with the patient in reverse Trendelenburg.  +---------+---------------+---------+-----------+----------+--------------+ RIGHT     CompressibilityPhasicitySpontaneityPropertiesThrombus Aging +---------+---------------+---------+-----------+----------+--------------+ CFV      Full           Yes      Yes                                 +---------+---------------+---------+-----------+----------+--------------+ SFJ      Full                                                        +---------+---------------+---------+-----------+----------+--------------+ FV Prox  Full                                                        +---------+---------------+---------+-----------+----------+--------------+  FV Mid   Full                                                        +---------+---------------+---------+-----------+----------+--------------+ FV DistalFull                                                        +---------+---------------+---------+-----------+----------+--------------+ PFV      Full                                                        +---------+---------------+---------+-----------+----------+--------------+ POP      Full           Yes      Yes                                 +---------+---------------+---------+-----------+----------+--------------+ PTV      Full                                                        +---------+---------------+---------+-----------+----------+--------------+ PERO     Full                                                        +---------+---------------+---------+-----------+----------+--------------+   +---------+---------------+---------+-----------+----------+--------------+ LEFT     CompressibilityPhasicitySpontaneityPropertiesThrombus Aging +---------+---------------+---------+-----------+----------+--------------+ CFV      Full           Yes      Yes                                 +---------+---------------+---------+-----------+----------+--------------+ SFJ      Full                                                         +---------+---------------+---------+-----------+----------+--------------+ FV Prox  Full                                                        +---------+---------------+---------+-----------+----------+--------------+ FV Mid   Full                                                        +---------+---------------+---------+-----------+----------+--------------+  FV DistalFull                                                        +---------+---------------+---------+-----------+----------+--------------+ PFV      Full                                                        +---------+---------------+---------+-----------+----------+--------------+ POP      Full           Yes      Yes                                 +---------+---------------+---------+-----------+----------+--------------+ PTV      Full                                                        +---------+---------------+---------+-----------+----------+--------------+ PERO     Full                                                        +---------+---------------+---------+-----------+----------+--------------+     Summary: RIGHT: - There is no evidence of deep vein thrombosis in the lower extremity.  - No cystic structure found in the popliteal fossa.  LEFT: - There is no evidence of deep vein thrombosis in the lower extremity.  - No cystic structure found in the popliteal fossa.  *See table(s) above for measurements and observations. Electronically signed by Waverly Ferrari MD on 04/20/2020 at 5:52:40 PM.    Final

## 2020-04-22 LAB — COMPREHENSIVE METABOLIC PANEL
ALT: 71 U/L — ABNORMAL HIGH (ref 0–44)
AST: 94 U/L — ABNORMAL HIGH (ref 15–41)
Albumin: 2.2 g/dL — ABNORMAL LOW (ref 3.5–5.0)
Alkaline Phosphatase: 48 U/L (ref 38–126)
Anion gap: 8 (ref 5–15)
BUN: 47 mg/dL — ABNORMAL HIGH (ref 8–23)
CO2: 24 mmol/L (ref 22–32)
Calcium: 8.5 mg/dL — ABNORMAL LOW (ref 8.9–10.3)
Chloride: 109 mmol/L (ref 98–111)
Creatinine, Ser: 1.43 mg/dL — ABNORMAL HIGH (ref 0.61–1.24)
GFR, Estimated: 52 mL/min — ABNORMAL LOW (ref >60)
Glucose, Bld: 227 mg/dL — ABNORMAL HIGH (ref 70–99)
Potassium: 4 mmol/L (ref 3.5–5.1)
Sodium: 141 mmol/L (ref 135–145)
Total Bilirubin: 0.5 mg/dL (ref 0.3–1.2)
Total Protein: 5.2 g/dL — ABNORMAL LOW (ref 6.5–8.1)

## 2020-04-22 LAB — CBC WITH DIFFERENTIAL/PLATELET
Abs Immature Granulocytes: 0.06 10*3/uL (ref 0.00–0.07)
Basophils Absolute: 0 10*3/uL (ref 0.0–0.1)
Basophils Relative: 0 %
Eosinophils Absolute: 0 10*3/uL (ref 0.0–0.5)
Eosinophils Relative: 0 %
HCT: 33.5 % — ABNORMAL LOW (ref 39.0–52.0)
Hemoglobin: 11.4 g/dL — ABNORMAL LOW (ref 13.0–17.0)
Immature Granulocytes: 1 %
Lymphocytes Relative: 7 %
Lymphs Abs: 0.4 10*3/uL — ABNORMAL LOW (ref 0.7–4.0)
MCH: 27.9 pg (ref 26.0–34.0)
MCHC: 34 g/dL (ref 30.0–36.0)
MCV: 82.1 fL (ref 80.0–100.0)
Monocytes Absolute: 0.2 10*3/uL (ref 0.1–1.0)
Monocytes Relative: 4 %
Neutro Abs: 5.4 10*3/uL (ref 1.7–7.7)
Neutrophils Relative %: 88 %
Platelets: 133 10*3/uL — ABNORMAL LOW (ref 150–400)
RBC: 4.08 MIL/uL — ABNORMAL LOW (ref 4.22–5.81)
RDW: 13.1 % (ref 11.5–15.5)
WBC: 6.1 10*3/uL (ref 4.0–10.5)
nRBC: 0 % (ref 0.0–0.2)

## 2020-04-22 LAB — C-REACTIVE PROTEIN: CRP: 5.6 mg/dL — ABNORMAL HIGH (ref <1.0)

## 2020-04-22 LAB — GLUCOSE, CAPILLARY
Glucose-Capillary: 181 mg/dL — ABNORMAL HIGH (ref 70–99)
Glucose-Capillary: 260 mg/dL — ABNORMAL HIGH (ref 70–99)
Glucose-Capillary: 243 mg/dL — ABNORMAL HIGH (ref 70–99)
Glucose-Capillary: 241 mg/dL — ABNORMAL HIGH (ref 70–99)

## 2020-04-22 LAB — D-DIMER, QUANTITATIVE: D-Dimer, Quant: >20 ug/mL-FEU — ABNORMAL HIGH (ref 0.00–0.50)

## 2020-04-22 LAB — HEMOGLOBIN A1C
Hgb A1c MFr Bld: 5.7 % — ABNORMAL HIGH (ref 4.8–5.6)
Mean Plasma Glucose: 116.89 mg/dL

## 2020-04-22 MED ORDER — AMLODIPINE BESYLATE 5 MG PO TABS
5.0000 mg | ORAL_TABLET | Freq: Every day | ORAL | Status: DC
  Administered 2020-04-22 – 2020-04-24 (×3): 5 mg via ORAL
  Filled 2020-04-22 (×3): qty 1

## 2020-04-22 MED ORDER — INSULIN ASPART 100 UNIT/ML ~~LOC~~ SOLN
3.0000 [IU] | Freq: Once | SUBCUTANEOUS | Status: AC
  Administered 2020-04-22: 3 [IU] via SUBCUTANEOUS

## 2020-04-22 MED ORDER — BARICITINIB 2 MG PO TABS: 2.0000 mg | ORAL_TABLET | Freq: Every day | ORAL | Status: DC

## 2020-04-22 NOTE — Progress Notes (Signed)
PROGRESS NOTE                                                                                                                                                                                                             Patient Demographics:    Trevor Martinez, is a 74 y.o. male, DOB - 1946-05-29, ZOX:[id number]  Outpatient Primary MD for the patient is Patient, No Pcp Per   Admit date - 04/18/2020   LOS - 4  Chief Complaint  Patient presents with   Shortness of Breath       Brief Narrative: Patient is a 74 y.o. male with PMHx of HTN, HLD-who for the past 1 week or so-has been having cough, chest pain, diarrhea-neighbor called EMS-subsequent evaluation revealed acute hypoxic respiratory failure due to COVID-19 pneumonia  COVID-19 vaccinated status: Vaccinated  Significant Events: 11/1>> [facility] COVID-19 pneumonia  Significant studies: 10/31>>Chest x-ray: Interstitial opacities bilaterally, soft tissue fullness in the left hilar region. 11/1>> CTA chest: No PE, multifocal pneumonia, coronary artery calcifications.  No enlarged mediastinal lymph nodes. 11/2>> lower extremity Doppler: No DVT  COVID-19 medications: Steroids: 10/31>> Remdesivir: 10/31>> Baricitinib: 11/1>>  Antibiotics: Rocephin: 10/31>> Zithromax: 10/31>>11/2  Microbiology data: 10/31 >>blood culture:No growth  Procedures: None  Consults: None  DVT prophylaxis: Lovenox at therapeutic dosing   Subjective:   On 4 L of oxygen-not in any distress.   Assessment  & Plan :   Acute Hypoxic Resp Failure due to Covid 19 Viral pneumonia and possible concurrent bacterial pneumonia: Slowly improving-remains on anywhere from 2 to 4 L of oxygen at rest-we will continue steroids and baricitinib-we will complete Remdesivir on 11/4.   Fever: afebrile O2 requirements:  SpO2: 97 % O2 Flow Rate (L/min): 4 L/min   COVID-19  Labs: Recent Labs    04/20/20 0808 04/21/20 0025 04/22/20 0306  DDIMER 17.35* >20.00* >20.00*  CRP 16.4* 9.6* 5.6*    No results found for: BNP  Recent Labs  Lab 04/18/20 1711 04/21/20 0025  PROCALCITON 2.31 1.34    Lab Results  Component Value Date   SARSCOV2NAA POSITIVE (A) 04/18/2020     Prone/Incentive Spirometry: encouraged  incentive spirometry use 3-4/hour.  Significantly elevated D-dimer: D-dimer continues to be significantly elevated-Doppler/CTA chest negative for VTE-continue therapeutic dosing of Lovenox until D-dimer starts trending down.  Steroid-induced hyperglycemia: CBGs stable with SSI-A1c stable at 5.7.  Transaminitis: Mild-likely secondary to COVID-19-stable for follow-up.  HTN: Continue lisinopril-hold HCTZ-follow and adjust.  HLD: Continue statin-watch LFTs.   ABG: No results found for: PHART, PCO2ART, PO2ART, HCO3, TCO2, ACIDBASEDEF, O2SAT  Vent Settings: N/A   Condition -Guarded  Family Communication  : Spoke with sister-Francis Janee Morn [phone]) over the phone on 11/4.  Code Status :  Full Code  Diet :  Diet Order            Diet Heart Room service appropriate? Yes; Fluid consistency: Thin  Diet effective now                  Disposition Plan  :   Status is: Inpatient  Remains inpatient appropriate because:Inpatient level of care appropriate due to severity of illness   Dispo: The patient is from: Home              Anticipated d/c is to: Home              Anticipated d/c date is: > 3 days              Patient currently is not medically stable to d/c.    Barriers to discharge: Hypoxia requiring O2 supplementation/complete 5 days of IV Remdesivir  Antimicorbials  :    Anti-infectives (From admission, onward)   Start     Dose/Rate Route Frequency Ordered Stop   04/19/20 2200  cefTRIAXone (ROCEPHIN) 2 g in sodium chloride 0.9 % 100 mL IVPB        2 g 200 mL/hr over 30 Minutes Intravenous Every 24 hours 04/18/20  2220 04/24/20 2159   04/19/20 1000  remdesivir 100 mg in sodium chloride 0.9 % 100 mL IVPB       "Followed by" Linked Group Details   100 mg 200 mL/hr over 30 Minutes Intravenous Daily 04/18/20 2137 04/22/20 0917   04/19/20 0000  azithromycin (ZITHROMAX) 500 mg in sodium chloride 0.9 % 250 mL IVPB        500 mg 250 mL/hr over 60 Minutes Intravenous Every 24 hours 04/18/20 2220 04/21/20 0200   04/18/20 2300  remdesivir 200 mg in sodium chloride 0.9% 250 mL IVPB       "Followed by" Linked Group Details   200 mg 580 mL/hr over 30 Minutes Intravenous Once 04/18/20 2137 04/19/20 0003   04/18/20 2300  cefTRIAXone (ROCEPHIN) 2 g in sodium chloride 0.9 % 100 mL IVPB        2 g 200 mL/hr over 30 Minutes Intravenous  Once 04/18/20 2258 04/19/20 0001      Inpatient Medications  Scheduled Meds:  amLODipine  5 mg Oral Daily   vitamin C  500 mg Oral Daily   [START ON 04/23/2020] baricitinib  2 mg Oral Daily   benzonatate  200 mg Oral TID   enoxaparin (LOVENOX) injection  60 mg Subcutaneous Q12H   insulin aspart  0-9 Units Subcutaneous TID WC   methylPREDNISolone (SOLU-MEDROL) injection  50 mg Intravenous Q12H   pravastatin  40 mg Oral Q supper   zinc sulfate  220 mg Oral Daily   Continuous Infusions:  cefTRIAXone (ROCEPHIN)  IV Stopped (04/22/20 0526)   PRN Meds:.acetaminophen **OR** acetaminophen, albuterol   Time Spent in minutes  25   See all Orders from today for further details   Jeoffrey Massed M.D on 04/22/2020 at 12:07 PM  To page go to [email or web address] - use universal password  [facility] -  Office  [phone]    Objective:   Vitals:   04/21/20 1619 04/21/20 2113 04/22/20 0507 04/22/20 0825  BP: (!) 160/83 (!) 146/74 140/81   Pulse:  96 60   Resp: 20 20 19 18   Temp: 97.8 F (36.6 C) 97.9 F (36.6 C) 98 F (36.7 C)   TempSrc: Oral Oral Oral   SpO2:  90% 97%   Weight:      Height:        Wt Readings from Last 3 Encounters:  04/18/20 61.2 kg     No intake or output data in the 24 hours ending 04/22/20 1207   Physical Exam Gen Exam:Alert awake-not in any distress HEENT:atraumatic, normocephalic Chest: B/L clear to auscultation anteriorly CVS:S1S2 regular Abdomen:soft non tender, non distended Extremities:no edema Neurology: Non focal Skin: no rash  Data Review:    CBC Recent Labs  Lab 04/18/20 1711 04/19/20 0441 04/20/20 0808 04/21/20 0025 04/22/20 0306  WBC 4.9 4.3 7.0 8.0 6.1  HGB 14.6 13.1 12.8* 12.4* 11.4*  HCT 43.9 38.1* 37.6* 36.5* 33.5*  PLT 151 130* 194 182 133*  MCV 81.8 81.2 81.2 81.7 82.1  MCH 27.2 27.9 27.6 27.7 27.9  MCHC 33.3 34.4 34.0 34.0 34.0  RDW 12.6 12.8 13.0 13.0 13.1  LYMPHSABS 0.3* 0.2* 0.7 0.2* 0.4*  MONOABS 0.1 0.1 0.2 0.2 0.2  EOSABS 0.0 0.0 0.0 0.0 0.0  BASOSABS 0.0 0.0 0.0 0.0 0.0    Chemistries  Recent Labs  Lab 04/18/20 1711 04/19/20 0441 04/20/20 0808 04/21/20 0025 04/22/20 0306  NA 140 138 140 140 141  K 4.0 4.2 3.9 4.0 4.0  CL 104 106 107 106 109  CO2 24 20* 20* 20* 24  GLUCOSE 140* 257* 195* 215* 227*  BUN 30* 28* 36* 36* 47*  CREATININE 1.17 1.06 0.95 0.90 1.43*  CALCIUM 8.4* 7.9* 8.6* 8.6* 8.5*  AST 61* 47* 36 37 94*  ALT 36 30 27 25  71*  ALKPHOS 28* 25* 29* 38 48  BILITOT 1.7* 0.8 0.7 0.6 0.5   ------------------------------------------------------------------------------------------------------------------ No results for input(s): CHOL, HDL, LDLCALC, TRIG, CHOLHDL, LDLDIRECT in the last 72 hours.  Lab Results  Component Value Date   HGBA1C 5.7 (H) 04/22/2020   ------------------------------------------------------------------------------------------------------------------ No results for input(s): TSH, T4TOTAL, T3FREE, THYROIDAB in the last 72 hours.  Invalid input(s): FREET3 ------------------------------------------------------------------------------------------------------------------ No results for input(s): VITAMINB12, FOLATE, FERRITIN,  TIBC, IRON, RETICCTPCT in the last 72 hours.  Coagulation profile No results for input(s): INR, PROTIME in the last 168 hours.  Recent Labs    04/21/20 0025 04/22/20 0306  DDIMER >20.00* >20.00*    Cardiac Enzymes No results for input(s): CKMB, TROPONINI, MYOGLOBIN in the last 168 hours.  Invalid input(s): CK ------------------------------------------------------------------------------------------------------------------ No results found for: BNP  Micro Results Recent Results (from the past 240 hour(s))  Respiratory Panel by RT PCR (Flu A&B, Covid) - Nasopharyngeal Swab     Status: Abnormal   Collection Time: 04/18/20  6:09 PM   Specimen: Nasopharyngeal Swab  Result Value Ref Range Status   SARS Coronavirus 2 by RT PCR POSITIVE (A) NEGATIVE Final    Comment: RESULT CALLED TO, READ BACK BY AND VERIFIED WITH: DR D RAY @2029  04/18/20 BY S GEZAHEGN (NOTE) SARS-CoV-2 target nucleic acids are DETECTED.  SARS-CoV-2 RNA is generally detectable in upper respiratory specimens  during the acute phase of infection. Positive results are indicative of the presence of the identified virus, but do not rule out bacterial infection or co-infection with  other pathogens not detected by the test. Clinical correlation with patient history and other diagnostic information is necessary to determine patient infection status. The expected result is Negative.  Fact Sheet for Patients:  https://www.moore.com/  Fact Sheet for Healthcare Providers: https://www.young.biz/  This test is not yet approved or cleared by the [place] FDA and  has been authorized for detection and/or diagnosis of SARS-CoV-2 by FDA under an Emergency Use Authorization (EUA).  This EUA will remain in effect (meaning this test can  be used) for the duration of  the COVID-19 declaration under Section 564(b)(1) of the Act, 21 U.S.C. section 360bbb-3(b)(1), unless the authorization  is terminated or revoked sooner.      Influenza A by PCR NEGATIVE NEGATIVE Final   Influenza B by PCR NEGATIVE NEGATIVE Final    Comment: (NOTE) The Xpert Xpress SARS-CoV-2/FLU/RSV assay is intended as an aid in  the diagnosis of influenza from Nasopharyngeal swab specimens and  should not be used as a sole basis for treatment. Nasal washings and  aspirates are unacceptable for Xpert Xpress SARS-CoV-2/FLU/RSV  testing.  Fact Sheet for Patients: https://www.moore.com/  Fact Sheet for Healthcare Providers: https://www.young.biz/  This test is not yet approved or cleared by the [place] FDA and  has been authorized for detection and/or diagnosis of SARS-CoV-2 by  FDA under an Emergency Use Authorization (EUA). This EUA will remain  in effect (meaning this test can be used) for the duration of the  Covid-19 declaration under Section 564(b)(1) of the Act, 21  U.S.C. section 360bbb-3(b)(1), unless the authorization is  terminated or revoked. Performed at [facility], [address], [place], [place] [address]   Blood Culture (routine x 2)     Status: None (Preliminary result)   Collection Time: 04/18/20  6:09 PM   Specimen: BLOOD  Result Value Ref Range Status   Specimen Description BLOOD RIGHT ANTECUBITAL  Final   Special Requests   Final    BOTTLES DRAWN AEROBIC AND ANAEROBIC Blood Culture adequate volume   Culture   Final    NO GROWTH 4 DAYS Performed at [facility], [address] [place], [place] [address]    Report Status PENDING  Incomplete  Blood Culture (routine x 2)     Status: None (Preliminary result)   Collection Time: 04/19/20  4:41 AM   Specimen: BLOOD  Result Value Ref Range Status   Specimen Description BLOOD LEFT ANTECUBITAL  Final   Special Requests   Final    BOTTLES DRAWN AEROBIC AND ANAEROBIC Blood Culture results may not be optimal due to an excessive volume of blood received in culture bottles    Culture   Final    NO GROWTH 3 DAYS Performed at [facility], [address] [place], [place] [address]    Report Status PENDING  Incomplete    Radiology Reports CT ANGIO CHEST PE W OR WO CONTRAST  Result Date: 04/19/2020 CLINICAL DATA:  Hypoxic, fever.  COVID-19 positive. EXAM: CT ANGIOGRAPHY CHEST WITH CONTRAST TECHNIQUE: Multidetector CT imaging of the chest was performed using the standard protocol during bolus administration of intravenous contrast. Multiplanar CT image reconstructions and MIPs were obtained to evaluate the vascular anatomy. CONTRAST:  38mL OMNIPAQUE IOHEXOL 350 MG/ML SOLN COMPARISON:  None. FINDINGS: Cardiovascular: Satisfactory opacification of the pulmonary arteries to the segmental level. No evidence of pulmonary embolism. Normal heart size. No pericardial effusion. Mild coronary artery calcifications are noted. Mediastinum/Nodes: No enlarged mediastinal, hilar, or  axillary lymph nodes. Thyroid gland, trachea, and esophagus demonstrate no significant findings. Lungs/Pleura: No pneumothorax or pleural effusion is noted. Bilateral multiple airspace opacities are noted consistent with multifocal pneumonia due to COVID-19. Upper Abdomen: No acute abnormality. Musculoskeletal: No chest wall abnormality. No acute or significant osseous findings. Review of the MIP images confirms the above findings. IMPRESSION: 1. No definite evidence of pulmonary embolus. 2. Mild coronary artery calcifications are noted suggesting coronary artery disease. 3. Bilateral multiple airspace opacities are noted consistent with multifocal pneumonia due to COVID-19. Electronically Signed   By: Lupita Raider M.D.   On: 04/19/2020 09:55   DG Chest Port 1 View  Result Date: 04/18/2020 CLINICAL DATA:  Shortness of breath. EXAM: PORTABLE CHEST 1 VIEW COMPARISON:  None. FINDINGS: The heart is normal in size. There is soft tissue fullness in the left hilar region. Diffuse fine interstitial opacities,  left greater than right and most prominent in the lower lung zones. There is slightly more patchy opacity at the left lung base. No pneumothorax or large pleural effusion. Biapical pleuroparenchymal scarring. No acute osseous abnormalities are seen. IMPRESSION: 1. Diffuse fine interstitial opacities, left greater than right and most prominent in the lower lung zones. Differential considerations favor infection, less likely pulmonary edema, neoplasm, or interstitial lung disease. 2. Soft tissue fullness in the left hilar region. Suspect underlying adenopathy. 3. Recommend clinical correlation. Radiographic follow-up is recommended after course of treatment. Should hilar fullness persist, recommend further characterization with chest CT. Electronically Signed   By: Narda Rutherford M.D.   On: 04/18/2020 18:40   VAS [place] LOWER EXTREMITY VENOUS (DVT)  Result Date: 04/20/2020  Lower Venous DVT Study Indications: Elevated d-dimer= 17.35.  Comparison Study: No prior study Performing Technologist: Gertie Fey MHA, RDMS, RVT, RDCS  Examination Guidelines: A complete evaluation includes B-mode imaging, spectral Doppler, color Doppler, and power Doppler as needed of all accessible portions of each vessel. Bilateral testing is considered an integral part of a complete examination. Limited examinations for reoccurring indications may be performed as noted. The reflux portion of the exam is performed with the patient in reverse Trendelenburg.  +---------+---------------+---------+-----------+----------+--------------+  RIGHT     Compressibility Phasicity Spontaneity Properties Thrombus Aging  +---------+---------------+---------+-----------+----------+--------------+  CFV       Full            Yes       Yes                                    +---------+---------------+---------+-----------+----------+--------------+  SFJ       Full                                                              +---------+---------------+---------+-----------+----------+--------------+  FV Prox   Full                                                             +---------+---------------+---------+-----------+----------+--------------+  FV Mid    Full                                                             +---------+---------------+---------+-----------+----------+--------------+  FV Distal Full                                                             +---------+---------------+---------+-----------+----------+--------------+  PFV       Full                                                             +---------+---------------+---------+-----------+----------+--------------+  POP       Full            Yes       Yes                                    +---------+---------------+---------+-----------+----------+--------------+  PTV       Full                                                             +---------+---------------+---------+-----------+----------+--------------+  PERO      Full                                                             +---------+---------------+---------+-----------+----------+--------------+   +---------+---------------+---------+-----------+----------+--------------+  LEFT      Compressibility Phasicity Spontaneity Properties Thrombus Aging  +---------+---------------+---------+-----------+----------+--------------+  CFV       Full            Yes       Yes                                    +---------+---------------+---------+-----------+----------+--------------+  SFJ       Full                                                             +---------+---------------+---------+-----------+----------+--------------+  FV Prox   Full                                                             +---------+---------------+---------+-----------+----------+--------------+  FV Mid    Full                                                              +---------+---------------+---------+-----------+----------+--------------+  FV Distal Full                                                             +---------+---------------+---------+-----------+----------+--------------+  PFV       Full                                                             +---------+---------------+---------+-----------+----------+--------------+  POP       Full            Yes       Yes                                    +---------+---------------+---------+-----------+----------+--------------+  PTV       Full                                                             +---------+---------------+---------+-----------+----------+--------------+  PERO      Full                                                             +---------+---------------+---------+-----------+----------+--------------+     Summary: RIGHT: - There is no evidence of deep vein thrombosis in the lower extremity.  - No cystic structure found in the popliteal fossa.  LEFT: - There is no evidence of deep vein thrombosis in the lower extremity.  - No cystic structure found in the popliteal fossa.  *See table(s) above for measurements and observations. Electronically signed by Waverly Ferrari MD on 04/20/2020 at 5:52:40 PM.    Final

## 2020-04-22 NOTE — TOC Initial Note (Signed)
Transition of Care [facility]) - Initial/Assessment Note    Patient Details  Name: Trevor Martinez MRN: [id number] Date of Birth: 1945-10-18  [facility] CM/SW Contact:    Mearl Latin, LCSW Phone Number: 04/22/2020, 2:00 PM  Clinical Narrative:                 Patient in agreement with SNF for rehab. [facility] has a bed available for patient when stable for discharge. CSW attempted to contact patient's sister, but no answer and no voicemail.   Expected Discharge Plan: Skilled Nursing Facility Barriers to Discharge: Continued Medical Work up   Patient Goals and CMS Choice Patient states their goals for this hospitalization and ongoing recovery are:: Rehab CMS Medicare.gov Compare Post Acute Care list provided to:: Patient Choice offered to / list presented to : Patient, Sibling  Expected Discharge Plan and Services Expected Discharge Plan: Skilled Nursing Facility In-house Referral: Clinical Social Work   Post Acute Care Choice: Skilled Nursing Facility Living arrangements for the past 2 months: Single Family Home                                      Prior Living Arrangements/Services Living arrangements for the past 2 months: Single Family Home Lives with:: Self Patient language and need for interpreter reviewed:: Yes Do you feel safe going back to the place where you live?: Yes      Need for Family Participation in Patient Care: Yes (Comment) Care giver support system in place?: No (comment)   Criminal Activity/Legal Involvement Pertinent to Current Situation/Hospitalization: No - Comment as needed  Activities of Daily Living Home Assistive Devices/Equipment: None ADL Screening (condition at time of admission) Patient's cognitive ability adequate to safely complete daily activities?: Yes Is the patient deaf or have difficulty hearing?: Yes Does the patient have difficulty seeing, even when wearing glasses/contacts?: No Does the patient have difficulty  concentrating, remembering, or making decisions?: Yes Patient able to express need for assistance with ADLs?: Yes Does the patient have difficulty dressing or bathing?: No Independently performs ADLs?: Yes (appropriate for developmental age) Does the patient have difficulty walking or climbing stairs?: Yes Weakness of Legs: Both Weakness of Arms/Hands: None  Permission Sought/Granted Permission sought to share information with : Facility Medical sales representative, Family Supports Permission granted to share information with : Yes, Verbal Permission Granted  Share Information with NAME: Scarlette Calico  Permission granted to share info w AGENCY: SNFs  Permission granted to share info w Relationship: Sister  Permission granted to share info w Contact Information: [phone]  Emotional Assessment   Attitude/Demeanor/Rapport:  (Hard of hearing) Affect (typically observed): Appropriate Orientation: : Oriented to Self, Oriented to Place, Oriented to Situation Alcohol / Substance Use: Not Applicable Psych Involvement: No (comment)  Admission diagnosis:  SOB (shortness of breath) [R06.02] Acute respiratory failure due to COVID-19 (HCC) [U07.1, J96.00] Pneumonia due to COVID-19 virus [U07.1, J12.82] Acute respiratory disease due to COVID-19 virus [U07.1, J06.9] Patient Active Problem List   Diagnosis Date Noted  . Acute respiratory failure due to COVID-19 (HCC) 04/18/2020  . Essential hypertension 04/18/2020  . Acute respiratory disease due to COVID-19 virus 04/18/2020   PCP:  Patient, No Pcp Per Pharmacy:   [facility] - [place], [place] - [address] [place] [address] Phone: [phone] Fax: [phone]     Social Determinants of Health (SDOH) Interventions  Readmission Risk Interventions No flowsheet data found.

## 2020-04-22 NOTE — Progress Notes (Signed)
Results for STEFEN, JUBA (MRN [id number]) as of 04/22/2020 13:59  Ref. Range 04/21/2020 16:39 04/21/2020 21:15 04/22/2020 07:34 04/22/2020 12:06  Glucose-Capillary Latest Ref Range: 70 - 99 mg/dL 264 (H) 158 (H) 309 (H) 260 (H)  Noted that blood sugars have been greater than 180 mg/dl. No history of diabetes. hgbA1C is 5.7%.  Recommend increasing Novolog correction scale to MODERATE TID if blood sugars continue to be elevated and while on steroids.   Smith Mince RN BSN CDE Diabetes Coordinator Pager: [phone]  8am-5pm

## 2020-04-22 NOTE — Progress Notes (Signed)
Physical Therapy Treatment Patient Details Name: Trevor Martinez MRN: [id number] DOB: [birth year] Today's Date: 04/22/2020    History of Present Illness 74 y.o. male with history of hypertension and hyperlipidemia was brought to the ER after patient was found to be hypoxic febrile and was having some diarrhea and feeling weak. Patient's neighbor called in for a wellness check since patient was not seen outside the house for the last 2 weeks. +COVID    PT Comments    Patient eager to walk, however moving very slowly to minimize coughing. At rest (seated) on 4L with sats 92%. After standing, sats down to 85% and could not recover with PLB. Increased to 6L for walking and standing exercises with sats 88%. By end of session, returned to sitting in chair with O2 4L and sats 88%.     Follow Up Recommendations  SNF;Supervision/Assistance - 24 hour     Equipment Recommendations  Other (comment) (TBD next venue)    Recommendations for Other Services       Precautions / Restrictions Precautions Precautions: Fall    Mobility  Bed Mobility                  Transfers Overall transfer level: Needs assistance Equipment used: Rolling walker (2 wheeled) Transfers: Sit to/from Stand Sit to Stand: Min guard         General transfer comment: vc for safe use of RW; no imbalance  Ambulation/Gait Ambulation/Gait assistance: Min assist Gait Distance (Feet): 22 Feet Assistive device: Rolling walker (2 wheeled) Gait Pattern/deviations: Decreased stride length;Narrow base of support;Step-to pattern;Trunk flexed Gait velocity: decr   General Gait Details: pt walking very slowly; every few steps stops to "work his legs" including mini-squats, knee curls, ankle pumps; pt accustomed to using a rollator and required vc for use of RW; He continued step-to pattern throughout and required assist to [employer] Mobility    Modified Rankin (Stroke  Patients Only)       Balance Overall balance assessment: Needs assistance Sitting-balance support: No upper extremity supported;Feet supported Sitting balance-Leahy Scale: Good     Standing balance support: Bilateral upper extremity supported Standing balance-Leahy Scale: Poor Standing balance comment: needed external support                            Cognition Arousal/Alertness: Awake/alert Behavior During Therapy: WFL for tasks assessed/performed Overall Cognitive Status: No family/caregiver present to determine baseline cognitive functioning Area of Impairment: Following commands                   Current Attention Level: Sustained   Following Commands: Follows one step commands with increased time       General Comments: feel delayed responses due to decr hearing. Knew today was election day and date.      Exercises General Exercises - Lower Extremity Ankle Circles/Pumps: AROM;Right;Left;10 reps;Standing (unilateral) Heel Slides: AROM;Right;Left;5 reps;Standing (x 3 sets) Mini-Sqauts: AROM;Both;5 reps;Standing (3 sets)    General Comments        Pertinent Vitals/Pain Pain Assessment: No/denies pain    Home Living                      Prior Function            PT Goals (current goals can now be found in the care plan section)  Acute Rehab PT Goals Patient Stated Goal: agrees wants to get stronger Time For Goal Achievement: 05/03/20 Potential to Achieve Goals: Good Progress towards PT goals: Progressing toward goals    Frequency    Min 2X/week      PT Plan Current plan remains appropriate    Co-evaluation              AM-PAC PT "6 Clicks" Mobility   Outcome Measure  Help needed turning from your back to your side while in a flat bed without using bedrails?: None Help needed moving from lying on your back to sitting on the side of a flat bed without using bedrails?: None Help needed moving to and from a bed to  a chair (including a wheelchair)?: A Little Help needed standing up from a chair using your arms (e.g., wheelchair or bedside chair)?: A Little Help needed to walk in hospital room?: A Little Help needed climbing 3-5 steps with a railing? : A Little 6 Click Score: 20    End of Session Equipment Utilized During Treatment: Gait belt;Oxygen Activity Tolerance: Patient tolerated treatment well (moving very slowly to minimize coughing) Patient left: in chair;with call bell/phone within reach;with chair alarm set Nurse Communication: Mobility status PT Visit Diagnosis: Unsteadiness on feet (R26.81);Muscle weakness (generalized) (M62.81)     Time: 8676-7209 PT Time Calculation (min) (ACUTE ONLY): 26 min  Charges:  $Gait Training: 8-22 mins $Therapeutic Exercise: 8-22 mins                      [name], PT Pager [phone]    [name] 04/22/2020, 2:17 PM

## 2020-04-22 NOTE — Progress Notes (Signed)
Report received by this RN. Patient resting comfortably with no needs at this time. °

## 2020-04-22 NOTE — NC FL2 (Signed)
[place] MEDICAID FL2 LEVEL OF CARE SCREENING TOOL     IDENTIFICATION  Patient Name: Trevor Martinez Birthdate: 11-13-1945 Sex: male Admission Date (Current Location): 04/18/2020  [facility] and [place] Number:  Producer, television/film/video and Address:  [facility], [address] [place], [place] [address]      Provider Number: [id number]  Attending Physician Name and Address:  Maretta Bees, MD  Relative Name and Phone Number:  Scarlette Calico, sister, [phone]    Current Level of Care: Hospital Recommended Level of Care: Skilled Nursing Facility Prior Approval Number:    Date Approved/Denied:   PASRR Number: [id number]  Discharge Plan: SNF    Current Diagnoses: Patient Active Problem List   Diagnosis Date Noted   Acute respiratory failure due to COVID-19 [facility]) 04/18/2020   Essential hypertension 04/18/2020   Acute respiratory disease due to COVID-19 virus 04/18/2020    Orientation RESPIRATION BLADDER Height & Weight     Self, Time, Place  O2 (Nasal cannula 4L) Incontinent Weight: 135 lb (61.2 kg) Height:  5\' 10"  (177.8 cm)  BEHAVIORAL SYMPTOMS/MOOD NEUROLOGICAL BOWEL NUTRITION STATUS      Incontinent Diet (Please see DC Summary)  AMBULATORY STATUS COMMUNICATION OF NEEDS Skin   Limited Assist Verbally Normal                       Personal Care Assistance Level of Assistance  Bathing, Feeding, Dressing Bathing Assistance: Limited assistance Feeding assistance: Independent Dressing Assistance: Limited assistance     Functional Limitations Info  Hearing   Hearing Info: Impaired      SPECIAL CARE FACTORS FREQUENCY  PT (By licensed PT), OT (By licensed OT)     PT Frequency: 5x/week OT Frequency: 5x/week            Contractures Contractures Info: Not present    Additional Factors Info  Insulin Sliding Scale, Code Status, Allergies, Isolation Precautions Code Status Info: Full Allergies Info: NKA   Insulin Sliding  Scale Info: See dc summary Isolation Precautions Info: COVID+     Current Medications (04/22/2020):  This is the current hospital active medication list Current Facility-Administered Medications  Medication Dose Route Frequency Provider Last Rate Last Admin   acetaminophen (TYLENOL) tablet 650 mg  650 mg Oral Q6H PRN 13/09/2019, MD       Or   acetaminophen (TYLENOL) suppository 650 mg  650 mg Rectal Q6H PRN Eduard Clos, MD       albuterol (VENTOLIN HFA) 108 (90 Base) MCG/ACT inhaler 2 puff  2 puff Inhalation Q4H PRN Ghimire, Eduard Clos, MD       amLODipine (NORVASC) tablet 5 mg  5 mg Oral Daily Werner Lean, MD   5 mg at 04/22/20 13/04/21   ascorbic acid (VITAMIN C) tablet 500 mg  500 mg Oral Daily [id number], MD   500 mg at 04/22/20 0842   [START ON 04/23/2020] baricitinib (OLUMIANT) tablet 2 mg  2 mg Oral Daily Ghimire, 13/10/2019, MD       benzonatate (TESSALON) capsule 200 mg  200 mg Oral TID Werner Lean, MD   200 mg at 04/22/20 0840   cefTRIAXone (ROCEPHIN) 2 g in sodium chloride 0.9 % 100 mL IVPB  2 g Intravenous Q24H 13/04/21, MD   Stopped at 04/22/20 0526   enoxaparin (LOVENOX) injection 60 mg  60 mg Subcutaneous Q12H Ghimire, 13/04/21, MD   60 mg at  04/22/20 0828   insulin aspart (novoLOG) injection 0-9 Units  0-9 Units Subcutaneous TID WC Maretta Bees, MD   2 Units at 04/22/20 0827   methylPREDNISolone sodium succinate (SOLU-MEDROL) 125 mg/2 mL injection 50 mg  50 mg Intravenous Q12H Maretta Bees, MD   50 mg at 04/22/20 0847   pravastatin (PRAVACHOL) tablet 40 mg  40 mg Oral Q supper Eduard Clos, MD   40 mg at 04/21/20 1818   zinc sulfate capsule 220 mg  220 mg Oral Daily Eduard Clos, MD   220 mg at 04/22/20 2706     Discharge Medications: Please see discharge summary for a list of discharge medications.  Relevant Imaging Results:  Relevant Lab Results:   Additional Information SSN:  [id number]  Mearl Latin, LCSW

## 2020-04-23 LAB — CBC WITH DIFFERENTIAL/PLATELET
Abs Immature Granulocytes: 0.06 10*3/uL (ref 0.00–0.07)
Basophils Absolute: 0 10*3/uL (ref 0.0–0.1)
Basophils Relative: 0 %
Eosinophils Absolute: 0 10*3/uL (ref 0.0–0.5)
Eosinophils Relative: 0 %
HCT: 32.9 % — ABNORMAL LOW (ref 39.0–52.0)
Hemoglobin: 11 g/dL — ABNORMAL LOW (ref 13.0–17.0)
Immature Granulocytes: 1 %
Lymphocytes Relative: 5 %
Lymphs Abs: 0.3 10*3/uL — ABNORMAL LOW (ref 0.7–4.0)
MCH: 27.2 pg (ref 26.0–34.0)
MCHC: 33.4 g/dL (ref 30.0–36.0)
MCV: 81.4 fL (ref 80.0–100.0)
Monocytes Absolute: 0.2 10*3/uL (ref 0.1–1.0)
Monocytes Relative: 4 %
Neutro Abs: 5.3 10*3/uL (ref 1.7–7.7)
Neutrophils Relative %: 90 %
Platelets: 134 10*3/uL — ABNORMAL LOW (ref 150–400)
RBC: 4.04 MIL/uL — ABNORMAL LOW (ref 4.22–5.81)
RDW: 13.1 % (ref 11.5–15.5)
WBC: 5.8 10*3/uL (ref 4.0–10.5)
nRBC: 0 % (ref 0.0–0.2)

## 2020-04-23 LAB — C-REACTIVE PROTEIN: CRP: 3.1 mg/dL — ABNORMAL HIGH (ref <1.0)

## 2020-04-23 LAB — COMPREHENSIVE METABOLIC PANEL
ALT: 56 U/L — ABNORMAL HIGH (ref 0–44)
AST: 54 U/L — ABNORMAL HIGH (ref 15–41)
Albumin: 2.2 g/dL — ABNORMAL LOW (ref 3.5–5.0)
Alkaline Phosphatase: 46 U/L (ref 38–126)
Anion gap: 8 (ref 5–15)
BUN: 40 mg/dL — ABNORMAL HIGH (ref 8–23)
CO2: 23 mmol/L (ref 22–32)
Calcium: 8.2 mg/dL — ABNORMAL LOW (ref 8.9–10.3)
Chloride: 109 mmol/L (ref 98–111)
Creatinine, Ser: 1.06 mg/dL (ref 0.61–1.24)
GFR, Estimated: >60 mL/min (ref >60)
Glucose, Bld: 188 mg/dL — ABNORMAL HIGH (ref 70–99)
Potassium: 4.1 mmol/L (ref 3.5–5.1)
Sodium: 140 mmol/L (ref 135–145)
Total Bilirubin: 0.6 mg/dL (ref 0.3–1.2)
Total Protein: 5.1 g/dL — ABNORMAL LOW (ref 6.5–8.1)

## 2020-04-23 LAB — CULTURE, BLOOD (ROUTINE X 2)
Culture: NO GROWTH
Special Requests: ADEQUATE

## 2020-04-23 LAB — GLUCOSE, CAPILLARY
Glucose-Capillary: 168 mg/dL — ABNORMAL HIGH (ref 70–99)
Glucose-Capillary: 184 mg/dL — ABNORMAL HIGH (ref 70–99)
Glucose-Capillary: 181 mg/dL — ABNORMAL HIGH (ref 70–99)
Glucose-Capillary: 250 mg/dL — ABNORMAL HIGH (ref 70–99)

## 2020-04-23 LAB — D-DIMER, QUANTITATIVE: D-Dimer, Quant: >20 ug/mL-FEU — ABNORMAL HIGH (ref 0.00–0.50)

## 2020-04-23 MED ORDER — BARICITINIB 2 MG PO TABS
4.0000 mg | ORAL_TABLET | Freq: Every day | ORAL | Status: DC
  Administered 2020-04-23 – 2020-04-27 (×5): 4 mg via ORAL
  Filled 2020-04-23 (×5): qty 2

## 2020-04-23 NOTE — Progress Notes (Signed)
Occupational Therapy Treatment Patient Details Name: Trevor Martinez MRN: [id number] DOB: [birth year] Today's Date: 04/23/2020    History of present illness 74 y.o. male with history of hypertension and hyperlipidemia was brought to the ER after patient was found to be hypoxic febrile and was having some diarrhea and feeling weak. Patient's neighbor called in for a wellness check since patient was not seen outside the house for the last 2 weeks. +COVID   OT comments  Pt progressing towards established OT goals. At rest, SpO2 95% on 2L and HR 70-80s. Pt performing toileting and hand hygiene at sink with Supervision-Min guard A. SpO2 87% on 3L and requiring seated rest break for fatigue and to elevate SpO2 into 90s. Pt performing functional mobility in hallway with Supervision and rollator; SpO2 90-87% and HR 80s. Due to decreased home support, continue to recommend dc to SNF. However, if able to have support at home, would be able to dc to home with HHOT.    Follow Up Recommendations  SNF    Equipment Recommendations  Other (comment) (Defer to next venue)    Recommendations for Other Services PT consult    Precautions / Restrictions Precautions Precautions: Fall       Mobility Bed Mobility               General bed mobility comments: In recliner upon arrival  Transfers Overall transfer level: Needs assistance Equipment used: 4-wheeled walker;None Transfers: Sit to/from Stand Sit to Stand: Supervision         General transfer comment: Supervision for safety    Balance Overall balance assessment: Needs assistance Sitting-balance support: No upper extremity supported;Feet supported Sitting balance-Leahy Scale: Good     Standing balance support: Bilateral upper extremity supported Standing balance-Leahy Scale: Poor Standing balance comment: needed external support                           ADL either performed or assessed with clinical judgement   ADL  Overall ADL's : Needs assistance/impaired     Grooming: Wash/dry hands;Supervision/safety;Standing Grooming Details (indicate cue type and reason): Pt performing hand hygiene with supervision for safety. Taking seated rest break after hand hygiene.                  Toilet Transfer: Min guard;Ambulation;Regular Toilet;Grab bars   Toileting- Clothing Manipulation and Hygiene: Supervision/safety;Sitting/lateral lean       Functional mobility during ADLs: Min guard (rollator) General ADL Comments: Pt requiring increased time and effort throughout session and activity. Performing at [facility] A level. Performing toileting and then functional mobility in hallway.     Vision       Perception     Praxis      Cognition Arousal/Alertness: Awake/alert Behavior During Therapy: WFL for tasks assessed/performed Overall Cognitive Status: No family/caregiver present to determine baseline cognitive functioning Area of Impairment: Following commands                       Following Commands: Follows one step commands with increased time       General Comments: Requiring increased time, but feel it is mostly due to [facility]. Very pleasant and agreeable.         Exercises     Shoulder Instructions       General Comments At rest, SpO2 95% on 2L and HR 70-80s. During activity (toileting and mobility in hallway), pt Spo2 90-87% on 3L  and HR 80s    Pertinent Vitals/ Pain       Pain Assessment: No/denies pain  Home Living                                          Prior Functioning/Environment              Frequency  Min 2X/week        Progress Toward Goals  OT Goals(current goals can now be found in the care plan section)  Progress towards OT goals: Progressing toward goals  Acute Rehab OT Goals Patient Stated Goal: agrees wants to get stronger OT Goal Formulation: With patient Time For Goal Achievement: 05/04/20 Potential to Achieve Goals:  Good ADL Goals Pt Will Perform Grooming: with supervision;sitting;standing Pt Will Perform Lower Body Dressing: with supervision;sit to/from stand Pt Will Transfer to Toilet: with supervision;ambulating;bedside commode Pt Will Perform Toileting - Clothing Manipulation and hygiene: sitting/lateral leans;sit to/from stand;with supervision Additional ADL Goal #1: Pt will independently monitor SpO2 and use purse lip breathing for ADLs Additional ADL Goal #2: Pt will independently verbalize three energy conservation techniques for ADLs and IADLs  Plan Discharge plan remains appropriate    Co-evaluation                 AM-PAC OT "6 Clicks" Daily Activity     Outcome Measure   Help from another person eating meals?: A Little Help from another person taking care of personal grooming?: A Little Help from another person toileting, which includes using toliet, bedpan, or urinal?: A Little Help from another person bathing (including washing, rinsing, drying)?: A Little Help from another person to put on and taking off regular upper body clothing?: A Little Help from another person to put on and taking off regular lower body clothing?: A Little 6 Click Score: 18    End of Session Equipment Utilized During Treatment: Oxygen (2-3L)  OT Visit Diagnosis: Unsteadiness on feet (R26.81);Other abnormalities of gait and mobility (R26.89);Muscle weakness (generalized) (M62.81)   Activity Tolerance Patient tolerated treatment well   Patient Left in chair;with call bell/phone within reach;with chair alarm set   Nurse Communication Mobility status;Other (comment)        Time: 0626-9485 OT Time Calculation (min): 30 min  Charges: OT General Charges $OT Visit: 1 Visit OT Treatments $Self Care/Home Management : 8-22 mins $Therapeutic Activity: 8-22 mins  Antony Sian MSOT, OTR/L Acute Rehab Pager: [phone] Office: [phone]   [name] 04/23/2020, 1:30 PM

## 2020-04-23 NOTE — Progress Notes (Addendum)
PROGRESS NOTE                                                                                                                                                                                                             Patient Demographics:    Trevor Martinez, is a 74 y.o. male, DOB - Jul 02, 1945, YBO:[id number]  Outpatient Primary MD for the patient is Patient, No Pcp Per   Admit date - 04/18/2020   LOS - 5  Chief Complaint  Patient presents with  . Shortness of Breath       Brief Narrative: Patient is a 74 y.o. male with PMHx of HTN, HLD-who for the past 1 week or so-has been having cough, chest pain, diarrhea-neighbor called EMS-subsequent evaluation revealed acute hypoxic respiratory failure due to COVID-19 pneumonia  COVID-19 vaccinated status: Vaccinated  Significant Events: 11/1>> Admit to [facility] for hypoxia due to COVID-19 pneumonia  Significant studies: 10/31>>Chest x-ray: Interstitial opacities bilaterally, soft tissue fullness in the left hilar region. 11/1>> CTA chest: No PE, multifocal pneumonia, coronary artery calcifications.  No enlarged mediastinal lymph nodes. 11/2>> lower extremity Doppler: No DVT  COVID-19 medications: Steroids: 10/31>> Remdesivir: 10/31>> 11/4 Baricitinib: 11/1>>  Antibiotics: Rocephin: 10/31>> Zithromax: 10/31>>11/2  Microbiology data: 10/31 >>blood culture:No growth  Procedures: None  Consults: None  DVT prophylaxis: Lovenox at therapeutic dosing   Subjective:   Lying comfortably in bed-on 3-4 L of oxygen at rest-requiring around 6 L of oxygen with ambulation.   Assessment  & Plan :   Acute Hypoxic Resp Failure due to Covid 19 Viral pneumonia and possible concurrent bacterial pneumonia: Hypoxia slowly improving-remains on steroids and baricitinib.  Completed Remdesivir and Rocephin on 11/4.  Continue to attempt to titrate off oxygen-on 3-4 L this morning-but  apparently requires around 6 L with ambulation.    Fever: afebrile O2 requirements:  SpO2: 94 % O2 Flow Rate (L/min): 3 L/min   COVID-19 Labs: Recent Labs    04/21/20 0025 04/22/20 0306 04/23/20 0028  DDIMER >20.00* >20.00* >20.00*  CRP 9.6* 5.6* 3.1*    No results found for: BNP  Recent Labs  Lab 04/18/20 1711 04/21/20 0025  PROCALCITON 2.31 1.34    Lab Results  Component Value Date   SARSCOV2NAA POSITIVE (A) 04/18/2020     Prone/Incentive Spirometry: encouraged  incentive spirometry use 3-4/hour.  Significantly elevated  D-dimer: D-dimer continues to be significantly elevated-Doppler/CTA chest negative for VTE-continue therapeutic dosing of Lovenox until D-dimer starts trending down.  Steroid-induced hyperglycemia: CBGs stable with SSI-A1c stable at 5.7.  Recent Labs    04/22/20 1700 04/22/20 2044 04/23/20 0750  GLUCAP 243* 241* 168*    Transaminitis: Mild-likely secondary to COVID-19-stable for follow-up.  HTN: Continue amlodipine-HCTZ/lisinopril on hold.  HLD: Continue statin-watch LFTs.  Debility/deconditioning: Secondary to COVID-19-rehab services recommending SNF at discharge.  ABG: No results found for: PHART, PCO2ART, PO2ART, HCO3, TCO2, ACIDBASEDEF, O2SAT  Vent Settings: N/A   Condition -Guarded  Family Communication  : Spoke with sister-Francis Janee Morn [phone]) over the phone on 11/4.  Code Status :  Full Code  Diet :  Diet Order            Diet Heart Room service appropriate? Yes; Fluid consistency: Thin  Diet effective now                  Disposition Plan  :   Status is: Inpatient  Remains inpatient appropriate because:Inpatient level of care appropriate due to severity of illness   Dispo: The patient is from: Home              Anticipated d/c is to: SNF.              Anticipated d/c date is: > 3 days              Patient currently is not medically stable to d/c.    Barriers to discharge: Hypoxia requiring O2  supplementation  Antimicorbials  :    Anti-infectives (From admission, onward)   Start     Dose/Rate Route Frequency Ordered Stop   04/19/20 2200  cefTRIAXone (ROCEPHIN) 2 g in sodium chloride 0.9 % 100 mL IVPB        2 g 200 mL/hr over 30 Minutes Intravenous Every 24 hours 04/18/20 2220 04/24/20 2159   04/19/20 1000  remdesivir 100 mg in sodium chloride 0.9 % 100 mL IVPB       "Followed by" Linked Group Details   100 mg 200 mL/hr over 30 Minutes Intravenous Daily 04/18/20 2137 04/22/20 0917   04/19/20 0000  azithromycin (ZITHROMAX) 500 mg in sodium chloride 0.9 % 250 mL IVPB        500 mg 250 mL/hr over 60 Minutes Intravenous Every 24 hours 04/18/20 2220 04/21/20 0200   04/18/20 2300  remdesivir 200 mg in sodium chloride 0.9% 250 mL IVPB       "Followed by" Linked Group Details   200 mg 580 mL/hr over 30 Minutes Intravenous Once 04/18/20 2137 04/19/20 0003   04/18/20 2300  cefTRIAXone (ROCEPHIN) 2 g in sodium chloride 0.9 % 100 mL IVPB        2 g 200 mL/hr over 30 Minutes Intravenous  Once 04/18/20 2258 04/19/20 0001      Inpatient Medications  Scheduled Meds: . amLODipine  5 mg Oral Daily  . vitamin C  500 mg Oral Daily  . baricitinib  4 mg Oral Daily  . benzonatate  200 mg Oral TID  . enoxaparin (LOVENOX) injection  60 mg Subcutaneous Q12H  . insulin aspart  0-9 Units Subcutaneous TID WC  . methylPREDNISolone (SOLU-MEDROL) injection  50 mg Intravenous Q12H  . pravastatin  40 mg Oral Q supper  . zinc sulfate  220 mg Oral Daily   Continuous Infusions: . cefTRIAXone (ROCEPHIN)  IV Stopped (04/22/20 2228)   PRN Meds:.acetaminophen **OR** acetaminophen, albuterol  Time Spent in minutes  25   See all Orders from today for further details   Jeoffrey MassedShanker Ellis Koffler M.D on 04/23/2020 at 10:13 AM  To page go to [email or web address] - use universal password  [facility] -  Office  [phone]    Objective:   Vitals:   04/22/20 1201 04/22/20 2000 04/23/20 0414 04/23/20 0722   BP: (!) 150/101 (!) 141/71 139/85   Pulse: 94 80 60   Resp: (!) 24 20 (!) 22   Temp: 97.8 F (36.6 C) 97.6 F (36.4 C) 97.7 F (36.5 C)   TempSrc: Oral Oral Oral   SpO2: 92% 96% 95% 94%  Weight:      Height:        Wt Readings from Last 3 Encounters:  04/18/20 61.2 kg     Intake/Output Summary (Last 24 hours) at 04/23/2020 1013 Last data filed at 04/23/2020 0900 Gross per 24 hour  Intake 420 ml  Output --  Net 420 ml     Physical Exam Gen Exam:Alert awake-not in any distress HEENT:atraumatic, normocephalic Chest: B/L clear to auscultation anteriorly CVS:S1S2 regular Abdomen:soft non tender, non distended Extremities:no edema Neurology: Non focal Skin: no rash   Data Review:    CBC Recent Labs  Lab 04/19/20 0441 04/20/20 0808 04/21/20 0025 04/22/20 0306 04/23/20 0028  WBC 4.3 7.0 8.0 6.1 5.8  HGB 13.1 12.8* 12.4* 11.4* 11.0*  HCT 38.1* 37.6* 36.5* 33.5* 32.9*  PLT 130* 194 182 133* 134*  MCV 81.2 81.2 81.7 82.1 81.4  MCH 27.9 27.6 27.7 27.9 27.2  MCHC 34.4 34.0 34.0 34.0 33.4  RDW 12.8 13.0 13.0 13.1 13.1  LYMPHSABS 0.2* 0.7 0.2* 0.4* 0.3*  MONOABS 0.1 0.2 0.2 0.2 0.2  EOSABS 0.0 0.0 0.0 0.0 0.0  BASOSABS 0.0 0.0 0.0 0.0 0.0    Chemistries  Recent Labs  Lab 04/19/20 0441 04/20/20 0808 04/21/20 0025 04/22/20 0306 04/23/20 0028  NA 138 140 140 141 140  K 4.2 3.9 4.0 4.0 4.1  CL 106 107 106 109 109  CO2 20* 20* 20* 24 23  GLUCOSE 257* 195* 215* 227* 188*  BUN 28* 36* 36* 47* 40*  CREATININE 1.06 0.95 0.90 1.43* 1.06  CALCIUM 7.9* 8.6* 8.6* 8.5* 8.2*  AST 47* 36 37 94* 54*  ALT 30 27 25  71* 56*  ALKPHOS 25* 29* 38 48 46  BILITOT 0.8 0.7 0.6 0.5 0.6   ------------------------------------------------------------------------------------------------------------------ No results for input(s): CHOL, HDL, LDLCALC, TRIG, CHOLHDL, LDLDIRECT in the last 72 hours.  Lab Results  Component Value Date   HGBA1C 5.7 (H) 04/22/2020    ------------------------------------------------------------------------------------------------------------------ No results for input(s): TSH, T4TOTAL, T3FREE, THYROIDAB in the last 72 hours.  Invalid input(s): FREET3 ------------------------------------------------------------------------------------------------------------------ No results for input(s): VITAMINB12, FOLATE, FERRITIN, TIBC, IRON, RETICCTPCT in the last 72 hours.  Coagulation profile No results for input(s): INR, PROTIME in the last 168 hours.  Recent Labs    04/22/20 0306 04/23/20 0028  DDIMER >20.00* >20.00*    Cardiac Enzymes No results for input(s): CKMB, TROPONINI, MYOGLOBIN in the last 168 hours.  Invalid input(s): CK ------------------------------------------------------------------------------------------------------------------ No results found for: BNP  Micro Results Recent Results (from the past 240 hour(s))  Respiratory Panel by RT PCR (Flu A&B, Covid) - Nasopharyngeal Swab     Status: Abnormal   Collection Time: 04/18/20  6:09 PM   Specimen: Nasopharyngeal Swab  Result Value Ref Range Status   SARS Coronavirus 2 by RT PCR POSITIVE (A) NEGATIVE Final    Comment: RESULT  CALLED TO, READ BACK BY AND VERIFIED WITH: DR D RAY  04/18/20 BY S GEZAHEGN (NOTE) SARS-CoV-2 target nucleic acids are DETECTED.  SARS-CoV-2 RNA is generally detectable in upper respiratory specimens  during the acute phase of infection. Positive results are indicative of the presence of the identified virus, but do not rule out bacterial infection or co-infection with other pathogens not detected by the test. Clinical correlation with patient history and other diagnostic information is necessary to determine patient infection status. The expected result is Negative.  Fact Sheet for Patients:  https://www.moore.com/  Fact Sheet for Healthcare  Providers: https://www.young.biz/  This test is not yet approved or cleared by the [place] FDA and  has been authorized for detection and/or diagnosis of SARS-CoV-2 by FDA under an Emergency Use Authorization (EUA).  This EUA will remain in effect (meaning this test can  be used) for the duration of  the COVID-19 declaration under Section 564(b)(1) of the Act, 21 U.S.C. section 360bbb-3(b)(1), unless the authorization is terminated or revoked sooner.      Influenza A by PCR NEGATIVE NEGATIVE Final   Influenza B by PCR NEGATIVE NEGATIVE Final    Comment: (NOTE) The Xpert Xpress SARS-CoV-2/FLU/RSV assay is intended as an aid in  the diagnosis of influenza from Nasopharyngeal swab specimens and  should not be used as a sole basis for treatment. Nasal washings and  aspirates are unacceptable for Xpert Xpress SARS-CoV-2/FLU/RSV  testing.  Fact Sheet for Patients: https://www.moore.com/  Fact Sheet for Healthcare Providers: https://www.young.biz/  This test is not yet approved or cleared by the [place] FDA and  has been authorized for detection and/or diagnosis of SARS-CoV-2 by  FDA under an Emergency Use Authorization (EUA). This EUA will remain  in effect (meaning this test can be used) for the duration of the  Covid-19 declaration under Section 564(b)(1) of the Act, 21  U.S.C. section 360bbb-3(b)(1), unless the authorization is  terminated or revoked. Performed at [facility], [address], [place], [place] [address]   Blood Culture (routine x 2)     Status: None   Collection Time: 04/18/20  6:09 PM   Specimen: BLOOD  Result Value Ref Range Status   Specimen Description BLOOD RIGHT ANTECUBITAL  Final   Special Requests   Final    BOTTLES DRAWN AEROBIC AND ANAEROBIC Blood Culture adequate volume   Culture   Final    NO GROWTH 5 DAYS Performed at [facility], [address],  [place], [place] [address]    Report Status 04/23/2020 FINAL  Final  Blood Culture (routine x 2)     Status: None (Preliminary result)   Collection Time: 04/19/20  4:41 AM   Specimen: BLOOD  Result Value Ref Range Status   Specimen Description BLOOD LEFT ANTECUBITAL  Final   Special Requests   Final    BOTTLES DRAWN AEROBIC AND ANAEROBIC Blood Culture results may not be optimal due to an excessive volume of blood received in culture bottles   Culture   Final    NO GROWTH 4 DAYS Performed at [facility], [address], [place], [place] [address]    Report Status PENDING  Incomplete    Radiology Reports CT ANGIO CHEST PE W OR WO CONTRAST  Result Date: 04/19/2020 CLINICAL DATA:  Hypoxic, fever.  COVID-19 positive. EXAM: CT ANGIOGRAPHY CHEST WITH CONTRAST TECHNIQUE: Multidetector CT imaging of the chest was performed using the standard protocol during bolus administration of intravenous  contrast. Multiplanar CT image reconstructions and MIPs were obtained to evaluate the vascular anatomy. CONTRAST:  1mL OMNIPAQUE IOHEXOL 350 MG/ML SOLN COMPARISON:  None. FINDINGS: Cardiovascular: Satisfactory opacification of the pulmonary arteries to the segmental level. No evidence of pulmonary embolism. Normal heart size. No pericardial effusion. Mild coronary artery calcifications are noted. Mediastinum/Nodes: No enlarged mediastinal, hilar, or axillary lymph nodes. Thyroid gland, trachea, and esophagus demonstrate no significant findings. Lungs/Pleura: No pneumothorax or pleural effusion is noted. Bilateral multiple airspace opacities are noted consistent with multifocal pneumonia due to COVID-19. Upper Abdomen: No acute abnormality. Musculoskeletal: No chest wall abnormality. No acute or significant osseous findings. Review of the MIP images confirms the above findings. IMPRESSION: 1. No definite evidence of pulmonary embolus. 2. Mild coronary artery calcifications are noted suggesting coronary artery  disease. 3. Bilateral multiple airspace opacities are noted consistent with multifocal pneumonia due to COVID-19. Electronically Signed   By: Lupita Raider M.D.   On: 04/19/2020 09:55   DG Chest Port 1 View  Result Date: 04/18/2020 CLINICAL DATA:  Shortness of breath. EXAM: PORTABLE CHEST 1 VIEW COMPARISON:  None. FINDINGS: The heart is normal in size. There is soft tissue fullness in the left hilar region. Diffuse fine interstitial opacities, left greater than right and most prominent in the lower lung zones. There is slightly more patchy opacity at the left lung base. No pneumothorax or large pleural effusion. Biapical pleuroparenchymal scarring. No acute osseous abnormalities are seen. IMPRESSION: 1. Diffuse fine interstitial opacities, left greater than right and most prominent in the lower lung zones. Differential considerations favor infection, less likely pulmonary edema, neoplasm, or interstitial lung disease. 2. Soft tissue fullness in the left hilar region. Suspect underlying adenopathy. 3. Recommend clinical correlation. Radiographic follow-up is recommended after course of treatment. Should hilar fullness persist, recommend further characterization with chest CT. Electronically Signed   By: Narda Rutherford M.D.   On: 04/18/2020 18:40   VAS [place] LOWER EXTREMITY VENOUS (DVT)  Result Date: 04/20/2020  Lower Venous DVT Study Indications: Elevated d-dimer= 17.35.  Comparison Study: No prior study Performing Technologist: Gertie Fey MHA, RDMS, RVT, RDCS  Examination Guidelines: A complete evaluation includes B-mode imaging, spectral Doppler, color Doppler, and power Doppler as needed of all accessible portions of each vessel. Bilateral testing is considered an integral part of a complete examination. Limited examinations for reoccurring indications may be performed as noted. The reflux portion of the exam is performed with the patient in reverse Trendelenburg.   +---------+---------------+---------+-----------+----------+--------------+ RIGHT    CompressibilityPhasicitySpontaneityPropertiesThrombus Aging +---------+---------------+---------+-----------+----------+--------------+ CFV      Full           Yes      Yes                                 +---------+---------------+---------+-----------+----------+--------------+ SFJ      Full                                                        +---------+---------------+---------+-----------+----------+--------------+ FV Prox  Full                                                        +---------+---------------+---------+-----------+----------+--------------+  FV Mid   Full                                                        +---------+---------------+---------+-----------+----------+--------------+ FV DistalFull                                                        +---------+---------------+---------+-----------+----------+--------------+ PFV      Full                                                        +---------+---------------+---------+-----------+----------+--------------+ POP      Full           Yes      Yes                                 +---------+---------------+---------+-----------+----------+--------------+ PTV      Full                                                        +---------+---------------+---------+-----------+----------+--------------+ PERO     Full                                                        +---------+---------------+---------+-----------+----------+--------------+   +---------+---------------+---------+-----------+----------+--------------+ LEFT     CompressibilityPhasicitySpontaneityPropertiesThrombus Aging +---------+---------------+---------+-----------+----------+--------------+ CFV      Full           Yes      Yes                                  +---------+---------------+---------+-----------+----------+--------------+ SFJ      Full                                                        +---------+---------------+---------+-----------+----------+--------------+ FV Prox  Full                                                        +---------+---------------+---------+-----------+----------+--------------+ FV Mid   Full                                                        +---------+---------------+---------+-----------+----------+--------------+  FV DistalFull                                                        +---------+---------------+---------+-----------+----------+--------------+ PFV      Full                                                        +---------+---------------+---------+-----------+----------+--------------+ POP      Full           Yes      Yes                                 +---------+---------------+---------+-----------+----------+--------------+ PTV      Full                                                        +---------+---------------+---------+-----------+----------+--------------+ PERO     Full                                                        +---------+---------------+---------+-----------+----------+--------------+     Summary: RIGHT: - There is no evidence of deep vein thrombosis in the lower extremity.  - No cystic structure found in the popliteal fossa.  LEFT: - There is no evidence of deep vein thrombosis in the lower extremity.  - No cystic structure found in the popliteal fossa.  *See table(s) above for measurements and observations. Electronically signed by Waverly Ferrari MD on 04/20/2020 at 5:52:40 PM.    Final

## 2020-04-24 ENCOUNTER — Inpatient Hospital Stay (HOSPITAL_COMMUNITY): Payer: Medicare Other

## 2020-04-24 DIAGNOSIS — I5031 Acute diastolic (congestive) heart failure: Secondary | ICD-10-CM | POA: Diagnosis not present

## 2020-04-24 LAB — COMPREHENSIVE METABOLIC PANEL
ALT: 42 U/L (ref 0–44)
AST: 36 U/L (ref 15–41)
Albumin: 2.1 g/dL — ABNORMAL LOW (ref 3.5–5.0)
Alkaline Phosphatase: 40 U/L (ref 38–126)
Anion gap: 8 (ref 5–15)
BUN: 43 mg/dL — ABNORMAL HIGH (ref 8–23)
CO2: 24 mmol/L (ref 22–32)
Calcium: 8.1 mg/dL — ABNORMAL LOW (ref 8.9–10.3)
Chloride: 111 mmol/L (ref 98–111)
Creatinine, Ser: 1.07 mg/dL (ref 0.61–1.24)
GFR, Estimated: >60 mL/min (ref >60)
Glucose, Bld: 198 mg/dL — ABNORMAL HIGH (ref 70–99)
Potassium: 4.2 mmol/L (ref 3.5–5.1)
Sodium: 143 mmol/L (ref 135–145)
Total Bilirubin: 0.4 mg/dL (ref 0.3–1.2)
Total Protein: 4.8 g/dL — ABNORMAL LOW (ref 6.5–8.1)

## 2020-04-24 LAB — CBC
HCT: 31.9 % — ABNORMAL LOW (ref 39.0–52.0)
Hemoglobin: 10.7 g/dL — ABNORMAL LOW (ref 13.0–17.0)
MCH: 27.7 pg (ref 26.0–34.0)
MCHC: 33.5 g/dL (ref 30.0–36.0)
MCV: 82.6 fL (ref 80.0–100.0)
Platelets: 186 10*3/uL (ref 150–400)
RBC: 3.86 MIL/uL — ABNORMAL LOW (ref 4.22–5.81)
RDW: 13.2 % (ref 11.5–15.5)
WBC: 7.3 10*3/uL (ref 4.0–10.5)
nRBC: 0 % (ref 0.0–0.2)

## 2020-04-24 LAB — CULTURE, BLOOD (ROUTINE X 2): Culture: NO GROWTH

## 2020-04-24 LAB — ECHOCARDIOGRAM LIMITED
Area-P 1/2: 3.03 cm2
Height: 70 in
S' Lateral: 2.3 cm
Weight: 2160 oz

## 2020-04-24 LAB — GLUCOSE, CAPILLARY
Glucose-Capillary: 157 mg/dL — ABNORMAL HIGH (ref 70–99)
Glucose-Capillary: 341 mg/dL — ABNORMAL HIGH (ref 70–99)
Glucose-Capillary: 208 mg/dL — ABNORMAL HIGH (ref 70–99)
Glucose-Capillary: 178 mg/dL — ABNORMAL HIGH (ref 70–99)

## 2020-04-24 LAB — D-DIMER, QUANTITATIVE: D-Dimer, Quant: 17.48 ug/mL-FEU — ABNORMAL HIGH (ref 0.00–0.50)

## 2020-04-24 LAB — C-REACTIVE PROTEIN: CRP: 1.7 mg/dL — ABNORMAL HIGH (ref <1.0)

## 2020-04-24 LAB — MAGNESIUM: Magnesium: 2.2 mg/dL (ref 1.7–2.4)

## 2020-04-24 MED ORDER — METHYLPREDNISOLONE SODIUM SUCC 40 MG IJ SOLR: 40.0000 mg | Freq: Two times a day (BID) | INTRAMUSCULAR | Status: DC

## 2020-04-24 MED ORDER — MAGNESIUM SULFATE 2 GM/50ML IV SOLN
2.0000 g | Freq: Once | INTRAVENOUS | Status: AC
  Administered 2020-04-24: 2 g via INTRAVENOUS
  Filled 2020-04-24: qty 50

## 2020-04-24 MED ORDER — METHYLPREDNISOLONE SODIUM SUCC 40 MG IJ SOLR
40.0000 mg | Freq: Every day | INTRAMUSCULAR | Status: DC
  Administered 2020-04-25 – 2020-04-27 (×3): 40 mg via INTRAVENOUS
  Filled 2020-04-24 (×3): qty 1

## 2020-04-24 MED ORDER — CARVEDILOL 3.125 MG PO TABS
3.1250 mg | ORAL_TABLET | Freq: Two times a day (BID) | ORAL | Status: DC
  Administered 2020-04-24 – 2020-04-27 (×6): 3.125 mg via ORAL
  Filled 2020-04-24 (×7): qty 1

## 2020-04-24 NOTE — Progress Notes (Addendum)
PROGRESS NOTE                                                                                                                                                                                                             Patient Demographics:    Trevor Martinez, is a 74 y.o. male, DOB - Jun 22, 1945, ZOX:[id number]  Outpatient Primary MD for the patient is Patient, No Pcp Per   Admit date - 04/18/2020   LOS - 6  Chief Complaint  Patient presents with  . Shortness of Breath       Brief Narrative: Patient is a 74 y.o. male with PMHx of HTN, HLD-who for the past 1 week or so-has been having cough, chest pain, diarrhea-neighbor called EMS-subsequent evaluation revealed acute hypoxic respiratory failure due to COVID-19 pneumonia  COVID-19 vaccinated status: Vaccinated  Significant Events: 11/1>> Admit to [facility] for hypoxia due to COVID-19 pneumonia  Significant studies: 10/31>>Chest x-ray: Interstitial opacities bilaterally, soft tissue fullness in the left hilar region. 11/1>> CTA chest: No PE, multifocal pneumonia, coronary artery calcifications.  No enlarged mediastinal lymph nodes. 11/2>> lower extremity Doppler: No DVT  COVID-19 medications: Steroids: 10/31>> Remdesivir: 10/31>> 11/4 Baricitinib: 11/1>>  Antibiotics: Rocephin: 10/31>> Zithromax: 10/31>>11/2  Microbiology data: 10/31 >>blood culture:No growth  Procedures: None  Consults: None  DVT prophylaxis: Lovenox at therapeutic dosing   Subjective:   Patient in bed, appears comfortable, denies any headache, no fever, no chest pain or pressure, no shortness of breath , no abdominal pain. No focal weakness.    Assessment  & Plan :   Acute Hypoxic Resp Failure due to Covid 19 Viral pneumonia and possible concurrent bacterial pneumonia: Hypoxia slowly improving-remains on steroids and baricitinib.  Completed Remdesivir and Rocephin on 11/4.  Continue to  titrate down steroids and oxygen, will require SNF upon discharge due to severe deconditioning.  SpO2: 98 % O2 Flow Rate (L/min): 2 L/min   Recent Labs  Lab 04/18/20 1711 04/18/20 1809 04/19/20 0441 04/19/20 1032 04/20/20 0808 04/21/20 0025 04/22/20 0306 04/23/20 0028 04/24/20 0343  WBC 4.9  --    < >  --  7.0 8.0 6.1 5.8 7.3  HGB 14.6  --    < >  --  12.8* 12.4* 11.4* 11.0* 10.7*  HCT 43.9  --    < >  --  37.6* 36.5*  33.5* 32.9* 31.9*  PLT 151  --    < >  --  194 182 133* 134* 186  CRP 21.4*  --    < >  --  16.4* 9.6* 5.6* 3.1* 1.7*  DDIMER 3.50*  --    < >  --  17.35* >20.00* >20.00* >20.00* 17.48*  PROCALCITON 2.31  --   --   --   --  1.34  --   --   --   AST 61*  --    < >  --  36 37 94* 54* 36  ALT 36  --    < >  --  27 25 71* 56* 42  ALKPHOS 28*  --    < >  --  29* 38 48 46 40  BILITOT 1.7*  --    < >  --  0.7 0.6 0.5 0.6 0.4  ALBUMIN 2.8*  --    < >  --  2.3* 2.3* 2.2* 2.2* 2.1*  LATICACIDVEN 2.9*  --   --  3.8*  --   --   --   --   --   SARSCOV2NAA  --  POSITIVE*  --   --   --   --   --   --   --    < > = values in this interval not displayed.      Prone/Incentive Spirometry: encouraged  incentive spirometry use 3-4/hour.  Asymptomatic 15 beat run of V. tach on 04/24/2020.  Switch Norvasc to Coreg, 1 g of IV magnesium, potassium stable, check echocardiogram to evaluate EF.    Significantly elevated D-dimer: D-dimer continues to be significantly elevated-Doppler/CTA chest negative for VTE-continue therapeutic dosing of Lovenox until D-dimer starts trending down. 2 weeks Eliquis upon DC.  Transaminitis: Mild-likely secondary to COVID-19-stable for follow-up.  LFTs trending down towards normal.  HTN: Continue Norvasc, HCTZ and lisinopril combination on hold.  HLD: Continue statin   Debility/deconditioning: Secondary to COVID-19-rehab services recommending SNF at discharge.  Steroid-induced hyperglycemia: CBGs stable with SSI-A1c stable at 5.7. Steroids being  tapered.  Recent Labs    04/23/20 1622 04/23/20 2029 04/24/20 0732  GLUCAP 181* 250* 157*   Lab Results  Component Value Date   HGBA1C 5.7 (H) 04/22/2020       Condition -Guarded  Family Communication  : previous MD Spoke with sister-Francis Janee Morn [phone]) over the phone on 11/4.  Code Status :  Full Code  Diet :  Diet Order            Diet Heart Room service appropriate? Yes; Fluid consistency: Thin  Diet effective now                  Disposition Plan  :   Status is: Inpatient  Remains inpatient appropriate because:Inpatient level of care appropriate due to severity of illness   Dispo: The patient is from: Home              Anticipated d/c is to: SNF.              Anticipated d/c date is: > 3 days              Patient currently is not medically stable to d/c.    Barriers to discharge: Hypoxia requiring O2 supplementation  Antimicorbials  :    Anti-infectives (From admission, onward)   Start     Dose/Rate Route Frequency Ordered Stop   04/19/20 2200  cefTRIAXone (ROCEPHIN) 2 g  in sodium chloride 0.9 % 100 mL IVPB        2 g 200 mL/hr over 30 Minutes Intravenous Every 24 hours 04/18/20 2220 04/23/20 2212   04/19/20 1000  remdesivir 100 mg in sodium chloride 0.9 % 100 mL IVPB       "Followed by" Linked Group Details   100 mg 200 mL/hr over 30 Minutes Intravenous Daily 04/18/20 2137 04/22/20 0917   04/19/20 0000  azithromycin (ZITHROMAX) 500 mg in sodium chloride 0.9 % 250 mL IVPB        500 mg 250 mL/hr over 60 Minutes Intravenous Every 24 hours 04/18/20 2220 04/21/20 0200   04/18/20 2300  remdesivir 200 mg in sodium chloride 0.9% 250 mL IVPB       "Followed by" Linked Group Details   200 mg 580 mL/hr over 30 Minutes Intravenous Once 04/18/20 2137 04/19/20 0003   04/18/20 2300  cefTRIAXone (ROCEPHIN) 2 g in sodium chloride 0.9 % 100 mL IVPB        2 g 200 mL/hr over 30 Minutes Intravenous  Once 04/18/20 2258 04/19/20 0001      Inpatient  Medications  Scheduled Meds: . amLODipine  5 mg Oral Daily  . vitamin C  500 mg Oral Daily  . baricitinib  4 mg Oral Daily  . benzonatate  200 mg Oral TID  . enoxaparin (LOVENOX) injection  60 mg Subcutaneous Q12H  . insulin aspart  0-9 Units Subcutaneous TID WC  . methylPREDNISolone (SOLU-MEDROL) injection  40 mg Intravenous Daily  . pravastatin  40 mg Oral Q supper  . zinc sulfate  220 mg Oral Daily   Continuous Infusions:  PRN Meds:.acetaminophen **OR** acetaminophen, albuterol   Time Spent in minutes  25   See all Orders from today for further details   Susa Raring M.D on 04/24/2020 at 9:08 AM  To page go to [email or web address] - use universal password  [facility] -  Office  [phone]    Objective:   Vitals:   04/23/20 1511 04/23/20 2032 04/23/20 2117 04/24/20 0430  BP: 130/66 139/75  139/68  Pulse: 82 81 81 60  Resp: 18 17  17   Temp: 98.6 F (37 C) 97.6 F (36.4 C) 98.1 F (36.7 C) 97.9 F (36.6 C)  TempSrc: Axillary Oral Oral Oral  SpO2: 97% 97%  98%  Weight:      Height:        Wt Readings from Last 3 Encounters:  04/18/20 61.2 kg     Intake/Output Summary (Last 24 hours) at 04/24/2020 0908 Last data filed at 04/24/2020 0857 Gross per 24 hour  Intake 550 ml  Output --  Net 550 ml     Physical Exam  Awake Alert, No new F.N deficits, Normal affect [place].AT,PERRAL Supple Neck,No JVD, No cervical lymphadenopathy appriciated.  Symmetrical Chest wall movement, Good air movement bilaterally, CTAB RRR,No Gallops, Rubs or new Murmurs, No Parasternal Heave +ve B.Sounds, Abd Soft, No tenderness, No organomegaly appriciated, No rebound - guarding or rigidity. No Cyanosis, Clubbing or edema, No new Rash or bruise    Data Review:    CBC Recent Labs  Lab 04/19/20 0441 04/19/20 0441 04/20/20 0808 04/21/20 0025 04/22/20 0306 04/23/20 0028 04/24/20 0343  WBC 4.3   < > 7.0 8.0 6.1 5.8 7.3  HGB 13.1   < > 12.8* 12.4* 11.4* 11.0* 10.7*  HCT  38.1*   < > 37.6* 36.5* 33.5* 32.9* 31.9*  PLT 130*   < > 194 182  133* 134* 186  MCV 81.2   < > 81.2 81.7 82.1 81.4 82.6  MCH 27.9   < > 27.6 27.7 27.9 27.2 27.7  MCHC 34.4   < > 34.0 34.0 34.0 33.4 33.5  RDW 12.8   < > 13.0 13.0 13.1 13.1 13.2  LYMPHSABS 0.2*  --  0.7 0.2* 0.4* 0.3*  --   MONOABS 0.1  --  0.2 0.2 0.2 0.2  --   EOSABS 0.0  --  0.0 0.0 0.0 0.0  --   BASOSABS 0.0  --  0.0 0.0 0.0 0.0  --    < > = values in this interval not displayed.    Chemistries  Recent Labs  Lab 04/20/20 0808 04/21/20 0025 04/22/20 0306 04/23/20 0028 04/24/20 0343  NA 140 140 141 140 143  K 3.9 4.0 4.0 4.1 4.2  CL 107 106 109 109 111  CO2 20* 20* 24 23 24   GLUCOSE 195* 215* 227* 188* 198*  BUN 36* 36* 47* 40* 43*  CREATININE 0.95 0.90 1.43* 1.06 1.07  CALCIUM 8.6* 8.6* 8.5* 8.2* 8.1*  AST 36 37 94* 54* 36  ALT 27 25 71* 56* 42  ALKPHOS 29* 38 48 46 40  BILITOT 0.7 0.6 0.5 0.6 0.4   ------------------------------------------------------------------------------------------------------------------ No results for input(s): CHOL, HDL, LDLCALC, TRIG, CHOLHDL, LDLDIRECT in the last 72 hours.  Lab Results  Component Value Date   HGBA1C 5.7 (H) 04/22/2020   ------------------------------------------------------------------------------------------------------------------ No results for input(s): TSH, T4TOTAL, T3FREE, THYROIDAB in the last 72 hours.  Invalid input(s): FREET3 ------------------------------------------------------------------------------------------------------------------ No results for input(s): VITAMINB12, FOLATE, FERRITIN, TIBC, IRON, RETICCTPCT in the last 72 hours.  Coagulation profile No results for input(s): INR, PROTIME in the last 168 hours.  Recent Labs    04/23/20 0028 04/24/20 0343  DDIMER >20.00* 17.48*    Cardiac Enzymes No results for input(s): CKMB, TROPONINI, MYOGLOBIN in the last 168 hours.  Invalid input(s):  CK ------------------------------------------------------------------------------------------------------------------ No results found for: BNP  Micro Results Recent Results (from the past 240 hour(s))  Respiratory Panel by RT PCR (Flu A&B, Covid) - Nasopharyngeal Swab     Status: Abnormal   Collection Time: 04/18/20  6:09 PM   Specimen: Nasopharyngeal Swab  Result Value Ref Range Status   SARS Coronavirus 2 by RT PCR POSITIVE (A) NEGATIVE Final    Comment: RESULT CALLED TO, READ BACK BY AND VERIFIED WITH: DR D RAY @2029  04/18/20 BY S GEZAHEGN (NOTE) SARS-CoV-2 target nucleic acids are DETECTED.  SARS-CoV-2 RNA is generally detectable in upper respiratory specimens  during the acute phase of infection. Positive results are indicative of the presence of the identified virus, but do not rule out bacterial infection or co-infection with other pathogens not detected by the test. Clinical correlation with patient history and other diagnostic information is necessary to determine patient infection status. The expected result is Negative.  Fact Sheet for Patients:   Fact Sheet for Healthcare Providers: 04/20/20  This test is not yet approved or cleared by the https://www.moore.com/ FDA and  has been authorized for detection and/or diagnosis of SARS-CoV-2 by FDA under an Emergency Use Authorization (EUA).  This EUA will remain in effect (meaning this test can  be used) for the duration of  the COVID-19 declaration under Section 564(b)(1) of the Act, 21 U.S.C. section 360bbb-3(b)(1), unless the authorization is terminated or revoked sooner.      Influenza A by PCR NEGATIVE NEGATIVE Final   Influenza B by PCR NEGATIVE NEGATIVE  Final    Comment: (NOTE) The Xpert Xpress SARS-CoV-2/FLU/RSV assay is intended as an aid in  the diagnosis of influenza from Nasopharyngeal swab specimens and  should not be used as a sole  basis for treatment. Nasal washings and  aspirates are unacceptable for Xpert Xpress SARS-CoV-2/FLU/RSV  testing.  Fact Sheet for Patients: https://www.moore.com/  Fact Sheet for Healthcare Providers: https://www.young.biz/  This test is not yet approved or cleared by the [place] FDA and  has been authorized for detection and/or diagnosis of SARS-CoV-2 by  FDA under an Emergency Use Authorization (EUA). This EUA will remain  in effect (meaning this test can be used) for the duration of the  Covid-19 declaration under Section 564(b)(1) of the Act, 21  U.S.C. section 360bbb-3(b)(1), unless the authorization is  terminated or revoked. Performed at [facility], [address] [place], [place] [address]   Blood Culture (routine x 2)     Status: None   Collection Time: 04/18/20  6:09 PM   Specimen: BLOOD  Result Value Ref Range Status   Specimen Description BLOOD RIGHT ANTECUBITAL  Final   Special Requests   Final    BOTTLES DRAWN AEROBIC AND ANAEROBIC Blood Culture adequate volume   Culture   Final    NO GROWTH 5 DAYS Performed at [facility], [address], [place], [place] [address]    Report Status 04/23/2020 FINAL  Final  Blood Culture (routine x 2)     Status: None   Collection Time: 04/19/20  4:41 AM   Specimen: BLOOD  Result Value Ref Range Status   Specimen Description BLOOD LEFT ANTECUBITAL  Final   Special Requests   Final    BOTTLES DRAWN AEROBIC AND ANAEROBIC Blood Culture results may not be optimal due to an excessive volume of blood received in culture bottles   Culture   Final    NO GROWTH 5 DAYS Performed at [facility], [address] [place], [place] [address]    Report Status 04/24/2020 FINAL  Final    Radiology Reports CT ANGIO CHEST PE W OR WO CONTRAST  Result Date: 04/19/2020 CLINICAL DATA:  Hypoxic, fever.  COVID-19 positive. EXAM: CT ANGIOGRAPHY CHEST WITH CONTRAST TECHNIQUE:  Multidetector CT imaging of the chest was performed using the standard protocol during bolus administration of intravenous contrast. Multiplanar CT image reconstructions and MIPs were obtained to evaluate the vascular anatomy. CONTRAST:  10mL OMNIPAQUE IOHEXOL 350 MG/ML SOLN COMPARISON:  None. FINDINGS: Cardiovascular: Satisfactory opacification of the pulmonary arteries to the segmental level. No evidence of pulmonary embolism. Normal heart size. No pericardial effusion. Mild coronary artery calcifications are noted. Mediastinum/Nodes: No enlarged mediastinal, hilar, or axillary lymph nodes. Thyroid gland, trachea, and esophagus demonstrate no significant findings. Lungs/Pleura: No pneumothorax or pleural effusion is noted. Bilateral multiple airspace opacities are noted consistent with multifocal pneumonia due to COVID-19. Upper Abdomen: No acute abnormality. Musculoskeletal: No chest wall abnormality. No acute or significant osseous findings. Review of the MIP images confirms the above findings. IMPRESSION: 1. No definite evidence of pulmonary embolus. 2. Mild coronary artery calcifications are noted suggesting coronary artery disease. 3. Bilateral multiple airspace opacities are noted consistent with multifocal pneumonia due to COVID-19. Electronically Signed   By: Lupita Raider M.D.   On: 04/19/2020 09:55   DG Chest Port 1 View  Result Date: 04/18/2020 CLINICAL DATA:  Shortness of breath. EXAM: PORTABLE CHEST 1 VIEW COMPARISON:  None. FINDINGS: The heart is normal in  size. There is soft tissue fullness in the left hilar region. Diffuse fine interstitial opacities, left greater than right and most prominent in the lower lung zones. There is slightly more patchy opacity at the left lung base. No pneumothorax or large pleural effusion. Biapical pleuroparenchymal scarring. No acute osseous abnormalities are seen. IMPRESSION: 1. Diffuse fine interstitial opacities, left greater than right and most prominent  in the lower lung zones. Differential considerations favor infection, less likely pulmonary edema, neoplasm, or interstitial lung disease. 2. Soft tissue fullness in the left hilar region. Suspect underlying adenopathy. 3. Recommend clinical correlation. Radiographic follow-up is recommended after course of treatment. Should hilar fullness persist, recommend further characterization with chest CT. Electronically Signed   By: Narda Rutherford M.D.   On: 04/18/2020 18:40   VAS [place] LOWER EXTREMITY VENOUS (DVT)  Result Date: 04/20/2020  Lower Venous DVT Study Indications: Elevated d-dimer= 17.35.  Comparison Study: No prior study Performing Technologist: Gertie Fey MHA, RDMS, RVT, RDCS  Examination Guidelines: A complete evaluation includes B-mode imaging, spectral Doppler, color Doppler, and power Doppler as needed of all accessible portions of each vessel. Bilateral testing is considered an integral part of a complete examination. Limited examinations for reoccurring indications may be performed as noted. The reflux portion of the exam is performed with the patient in reverse Trendelenburg.  +---------+---------------+---------+-----------+----------+--------------+ RIGHT    CompressibilityPhasicitySpontaneityPropertiesThrombus Aging +---------+---------------+---------+-----------+----------+--------------+ CFV      Full           Yes      Yes                                 +---------+---------------+---------+-----------+----------+--------------+ SFJ      Full                                                        +---------+---------------+---------+-----------+----------+--------------+ FV Prox  Full                                                        +---------+---------------+---------+-----------+----------+--------------+ FV Mid   Full                                                         +---------+---------------+---------+-----------+----------+--------------+ FV DistalFull                                                        +---------+---------------+---------+-----------+----------+--------------+ PFV      Full                                                        +---------+---------------+---------+-----------+----------+--------------+  POP      Full           Yes      Yes                                 +---------+---------------+---------+-----------+----------+--------------+ PTV      Full                                                        +---------+---------------+---------+-----------+----------+--------------+ PERO     Full                                                        +---------+---------------+---------+-----------+----------+--------------+   +---------+---------------+---------+-----------+----------+--------------+ LEFT     CompressibilityPhasicitySpontaneityPropertiesThrombus Aging +---------+---------------+---------+-----------+----------+--------------+ CFV      Full           Yes      Yes                                 +---------+---------------+---------+-----------+----------+--------------+ SFJ      Full                                                        +---------+---------------+---------+-----------+----------+--------------+ FV Prox  Full                                                        +---------+---------------+---------+-----------+----------+--------------+ FV Mid   Full                                                        +---------+---------------+---------+-----------+----------+--------------+ FV DistalFull                                                        +---------+---------------+---------+-----------+----------+--------------+ PFV      Full                                                         +---------+---------------+---------+-----------+----------+--------------+ POP      Full           Yes      Yes                                 +---------+---------------+---------+-----------+----------+--------------+  PTV      Full                                                        +---------+---------------+---------+-----------+----------+--------------+ PERO     Full                                                        +---------+---------------+---------+-----------+----------+--------------+     Summary: RIGHT: - There is no evidence of deep vein thrombosis in the lower extremity.  - No cystic structure found in the popliteal fossa.  LEFT: - There is no evidence of deep vein thrombosis in the lower extremity.  - No cystic structure found in the popliteal fossa.  *See table(s) above for measurements and observations. Electronically signed by Waverly Ferrarihristopher Dickson MD on 04/20/2020 at 5:52:40 PM.    Final

## 2020-04-24 NOTE — Progress Notes (Signed)
  Echocardiogram 2D Echocardiogram  limited has been performed.  Leta Jungling M 04/24/2020, 2:57 PM

## 2020-04-24 NOTE — Progress Notes (Signed)
9X50 Romero Belling:  Per new order from Dr. Thedore Mins administering Magnesium.

## 2020-04-24 NOTE — Progress Notes (Signed)
Sent note to Dr. Thedore Mins:  2Z22 Romero Belling:  Just to update you ~ Tele just called to report that pt. had an 11 beat run of vtach at 09:30.  Pt. is currently asymptomatic (1.5 hrs. post run).

## 2020-04-24 NOTE — Progress Notes (Signed)
Received phone call from telemetry.  She stated that patient has an 11 beat run of V-tach at 09:30 a.m.  Notified physcian.

## 2020-04-24 NOTE — Plan of Care (Signed)
Patient is currently resting in bed. OOB to BSC. VSS. Remains on RA. No complaints overnight. Call bell within reach. Bed alarm on.   Problem: Education: Goal: Knowledge of General Education information will improve Description: Including pain rating scale, medication(s)/side effects and non-pharmacologic comfort measures Outcome: Progressing   Problem: Health Behavior/Discharge Planning: Goal: Ability to manage health-related needs will improve Outcome: Progressing   Problem: Clinical Measurements: Goal: Ability to maintain clinical measurements within normal limits will improve Outcome: Progressing Goal: Will remain free from infection Outcome: Progressing Goal: Diagnostic test results will improve Outcome: Progressing Goal: Respiratory complications will improve Outcome: Progressing Goal: Cardiovascular complication will be avoided Outcome: Progressing   Problem: Activity: Goal: Risk for activity intolerance will decrease Outcome: Progressing   Problem: Nutrition: Goal: Adequate nutrition will be maintained Outcome: Progressing   Problem: Coping: Goal: Level of anxiety will decrease Outcome: Progressing   Problem: Elimination: Goal: Will not experience complications related to bowel motility Outcome: Progressing Goal: Will not experience complications related to urinary retention Outcome: Progressing   Problem: Pain Managment: Goal: General experience of comfort will improve Outcome: Progressing   Problem: Safety: Goal: Ability to remain free from injury will improve Outcome: Progressing   Problem: Skin Integrity: Goal: Risk for impaired skin integrity will decrease Outcome: Progressing   Problem: Education: Goal: Knowledge of risk factors and measures for prevention of condition will improve Outcome: Progressing   Problem: Coping: Goal: Psychosocial and spiritual needs will be supported Outcome: Progressing   Problem: Respiratory: Goal: Will maintain  a patent airway Outcome: Progressing Goal: Complications related to the disease process, condition or treatment will be avoided or minimized Outcome: Progressing

## 2020-04-25 LAB — CBC
HCT: 32.1 % — ABNORMAL LOW (ref 39.0–52.0)
Hemoglobin: 10.7 g/dL — ABNORMAL LOW (ref 13.0–17.0)
MCH: 27.8 pg (ref 26.0–34.0)
MCHC: 33.3 g/dL (ref 30.0–36.0)
MCV: 83.4 fL (ref 80.0–100.0)
Platelets: 236 10*3/uL (ref 150–400)
RBC: 3.85 MIL/uL — ABNORMAL LOW (ref 4.22–5.81)
RDW: 13.1 % (ref 11.5–15.5)
WBC: 9.5 10*3/uL (ref 4.0–10.5)
nRBC: 0 % (ref 0.0–0.2)

## 2020-04-25 LAB — COMPREHENSIVE METABOLIC PANEL
ALT: 37 U/L (ref 0–44)
AST: 32 U/L (ref 15–41)
Albumin: 2 g/dL — ABNORMAL LOW (ref 3.5–5.0)
Alkaline Phosphatase: 39 U/L (ref 38–126)
Anion gap: 5 (ref 5–15)
BUN: 38 mg/dL — ABNORMAL HIGH (ref 8–23)
CO2: 24 mmol/L (ref 22–32)
Calcium: 7.8 mg/dL — ABNORMAL LOW (ref 8.9–10.3)
Chloride: 111 mmol/L (ref 98–111)
Creatinine, Ser: 0.92 mg/dL (ref 0.61–1.24)
GFR, Estimated: >60 mL/min (ref >60)
Glucose, Bld: 132 mg/dL — ABNORMAL HIGH (ref 70–99)
Potassium: 4.1 mmol/L (ref 3.5–5.1)
Sodium: 140 mmol/L (ref 135–145)
Total Bilirubin: 0.5 mg/dL (ref 0.3–1.2)
Total Protein: 4.6 g/dL — ABNORMAL LOW (ref 6.5–8.1)

## 2020-04-25 LAB — MAGNESIUM: Magnesium: 2.4 mg/dL (ref 1.7–2.4)

## 2020-04-25 LAB — C-REACTIVE PROTEIN: CRP: 0.9 mg/dL (ref <1.0)

## 2020-04-25 LAB — D-DIMER, QUANTITATIVE: D-Dimer, Quant: 11.15 ug/mL-FEU — ABNORMAL HIGH (ref 0.00–0.50)

## 2020-04-25 LAB — GLUCOSE, CAPILLARY
Glucose-Capillary: 78 mg/dL (ref 70–99)
Glucose-Capillary: 94 mg/dL (ref 70–99)
Glucose-Capillary: 94 mg/dL (ref 70–99)
Glucose-Capillary: 171 mg/dL — ABNORMAL HIGH (ref 70–99)

## 2020-04-25 LAB — BRAIN NATRIURETIC PEPTIDE: B Natriuretic Peptide: 68 pg/mL (ref 0.0–100.0)

## 2020-04-25 NOTE — Progress Notes (Signed)
PROGRESS NOTE                                                                                                                                                                                                             Patient Demographics:    Trevor Martinez, is a 74 y.o. male, DOB - 03-25-1946, ZOX:[id number]  Outpatient Primary MD for the patient is Patient, No Pcp Per   Admit date - 04/18/2020   LOS - 7  Chief Complaint  Patient presents with  . Shortness of Breath       Brief Narrative: Patient is a 74 y.o. male with PMHx of HTN, HLD-who for the past 1 week or so-has been having cough, chest pain, diarrhea-neighbor called EMS-subsequent evaluation revealed acute hypoxic respiratory failure due to COVID-19 pneumonia  COVID-19 vaccinated status: Vaccinated  Significant Events: 11/1>> Admit to [facility] for hypoxia due to COVID-19 pneumonia  Significant studies: 10/31>>Chest x-ray: Interstitial opacities bilaterally, soft tissue fullness in the left hilar region. 11/1>> CTA chest: No PE, multifocal pneumonia, coronary artery calcifications.  No enlarged mediastinal lymph nodes. 11/2>> lower extremity Doppler: No DVT 11/6 TTE>>  Left ventricular ejection fraction, by estimation, is 65 to 70%. No WMA, Grade 1 Diastolic CHF    COVID-19 medications: Steroids: 10/31>> Remdesivir: 10/31>> 11/4 Baricitinib: 11/1>>  Antibiotics: Rocephin: 10/31>> Zithromax: 10/31>>11/2  Microbiology data: 10/31 >>blood culture:No growth  Procedures: None  Consults: None  DVT prophylaxis:  Lovenox at therapeutic dosing   Subjective:   Patient in bed, appears comfortable, denies any headache, no fever, no chest pain or pressure, no shortness of breath , no abdominal pain. No focal weakness.   Assessment  & Plan :   Acute Hypoxic Resp Failure due to Covid 19 Viral pneumonia and possible concurrent bacterial pneumonia: Hypoxia  slowly improving-remains on steroids and baricitinib.  Completed Remdesivir and Rocephin on 11/4.  Continue to titrate down steroids and oxygen, will require HHPT vs SNF upon discharge due to severe deconditioning.  SpO2: 95 % O2 Flow Rate (L/min): 2 L/min  Prone/Incentive Spirometry: encouraged  incentive spirometry use 3-4/hour.  Recent Labs  Lab 04/18/20 1711 04/18/20 1809 04/19/20 0441 04/19/20 1032 04/20/20 0808 04/21/20 0025 04/22/20 0306 04/23/20 0028 04/24/20 0343 04/25/20 0310  WBC 4.9  --    < >  --    < > 8.0  6.1 5.8 7.3 9.5  HGB 14.6  --    < >  --    < > 12.4* 11.4* 11.0* 10.7* 10.7*  HCT 43.9  --    < >  --    < > 36.5* 33.5* 32.9* 31.9* 32.1*  PLT 151  --    < >  --    < > 182 133* 134* 186 236  CRP 21.4*  --    < >  --    < > 9.6* 5.6* 3.1* 1.7* 0.9  BNP  --   --   --   --   --   --   --   --   --  68.0  DDIMER 3.50*  --    < >  --    < > >20.00* >20.00* >20.00* 17.48* 11.15*  PROCALCITON 2.31  --   --   --   --  1.34  --   --   --   --   AST 61*  --    < >  --    < > 37 94* 54* 36 32  ALT 36  --    < >  --    < > 25 71* 56* 42 37  ALKPHOS 28*  --    < >  --    < > 38 48 46 40 39  BILITOT 1.7*  --    < >  --    < > 0.6 0.5 0.6 0.4 0.5  ALBUMIN 2.8*  --    < >  --    < > 2.3* 2.2* 2.2* 2.1* 2.0*  LATICACIDVEN 2.9*  --   --  3.8*  --   --   --   --   --   --   SARSCOV2NAA  --  POSITIVE*  --   --   --   --   --   --   --   --    < > = values in this interval not displayed.     Asymptomatic 15 beat run of V. tach on 04/24/2020.  He has been switched from Norvasc to Coreg, electrolytes stable, symptom-free, echo shows a preserved EF without any wall motion abnormality.  No further work-up.  Significantly elevated D-dimer: D-dimer continues to be significantly elevated-Doppler/CTA chest negative for VTE-continue therapeutic dosing of Lovenox until D-dimer starts trending down. 2 weeks Eliquis upon DC.  Transaminitis: Mild-likely secondary to COVID-19-stable for  follow-up.  LFTs trending down towards normal.  HTN: Continue Coreg, Norvasc-  HCTZ and lisinopril combination on hold.  HLD: Continue statin   Debility/deconditioning: Secondary to COVID-19-rehab services recommending SNF at discharge.  Steroid-induced hyperglycemia: CBGs stable with SSI-A1c stable at 5.7. Steroids being tapered.  Recent Labs    04/23/20 1622 04/23/20 2029 04/24/20 0732  GLUCAP 181* 250* 157*   Lab Results  Component Value Date   HGBA1C 5.7 (H) 04/22/2020       Condition - Fair  Family Communication  : previous MD Spoke with sister-Francis Janee Mornhompson [phone]) over the phone on 11/4.  Code Status :  Full Code  Diet :  Diet Order            Diet Heart Room service appropriate? Yes; Fluid consistency: Thin  Diet effective now                  Disposition Plan  :   Status is: Inpatient  Remains inpatient appropriate because:Inpatient level of  care appropriate due to severity of illness   Dispo: The patient is from: Home              Anticipated d/c is to: SNF.              Anticipated d/c date is: > 3 days              Patient currently is not medically stable to d/c.    Barriers to discharge: Hypoxia requiring O2 supplementation  Antimicorbials  :    Anti-infectives (From admission, onward)   Start     Dose/Rate Route Frequency Ordered Stop   04/19/20 2200  cefTRIAXone (ROCEPHIN) 2 g in sodium chloride 0.9 % 100 mL IVPB        2 g 200 mL/hr over 30 Minutes Intravenous Every 24 hours 04/18/20 2220 04/23/20 2212   04/19/20 1000  remdesivir 100 mg in sodium chloride 0.9 % 100 mL IVPB       "Followed by" Linked Group Details   100 mg 200 mL/hr over 30 Minutes Intravenous Daily 04/18/20 2137 04/22/20 0917   04/19/20 0000  azithromycin (ZITHROMAX) 500 mg in sodium chloride 0.9 % 250 mL IVPB        500 mg 250 mL/hr over 60 Minutes Intravenous Every 24 hours 04/18/20 2220 04/21/20 0200   04/18/20 2300  remdesivir 200 mg in sodium  chloride 0.9% 250 mL IVPB       "Followed by" Linked Group Details   200 mg 580 mL/hr over 30 Minutes Intravenous Once 04/18/20 2137 04/19/20 0003   04/18/20 2300  cefTRIAXone (ROCEPHIN) 2 g in sodium chloride 0.9 % 100 mL IVPB        2 g 200 mL/hr over 30 Minutes Intravenous  Once 04/18/20 2258 04/19/20 0001      Inpatient Medications  Scheduled Meds: . vitamin C  500 mg Oral Daily  . baricitinib  4 mg Oral Daily  . benzonatate  200 mg Oral TID  . carvedilol  3.125 mg Oral BID WC  . enoxaparin (LOVENOX) injection  60 mg Subcutaneous Q12H  . insulin aspart  0-9 Units Subcutaneous TID WC  . methylPREDNISolone (SOLU-MEDROL) injection  40 mg Intravenous Daily  . pravastatin  40 mg Oral Q supper  . zinc sulfate  220 mg Oral Daily   Continuous Infusions:  PRN Meds:.acetaminophen **OR** acetaminophen, albuterol   Time Spent in minutes  25   See all Orders from today for further details   Susa Raring M.D on 04/25/2020 at 10:00 AM  To page go to [email or web address] - use universal password  [facility] -  Office  [phone]    Objective:   Vitals:   04/24/20 1325 04/24/20 2142 04/25/20 0459 04/25/20 0730  BP: (!) 154/82 125/66 118/85 135/88  Pulse:  67 63   Resp: (!) 21  Temp: 98.1 F (36.7 C) 98.3 F (36.8 C) 97.9 F (36.6 C) 98.6 F (37 C)  TempSrc: Oral Oral Oral Oral  SpO2:  98% 95%   Weight:      Height:        Wt Readings from Last 3 Encounters:  04/18/20 61.2 kg     Intake/Output Summary (Last 24 hours) at 04/25/2020 1000 Last data filed at 04/24/2020 2200 Gross per 24 hour  Intake 580 ml  Output --  Net 580 ml     Physical Exam  Awake Alert, No new F.N deficits, Normal affect [place].AT,PERRAL Supple Neck,No JVD, No cervical  lymphadenopathy appriciated.  Symmetrical Chest wall movement, Good air movement bilaterally, CTAB RRR,No Gallops, Rubs or new Murmurs, No Parasternal Heave +ve B.Sounds, Abd Soft, No tenderness, No organomegaly  appriciated, No rebound - guarding or rigidity. No Cyanosis, Clubbing or edema, No new Rash or bruise     Data Review:    CBC Recent Labs  Lab 04/19/20 0441 04/19/20 0441 04/20/20 0808 04/20/20 0808 04/21/20 0025 04/22/20 0306 04/23/20 0028 04/24/20 0343 04/25/20 0310  WBC 4.3   < > 7.0   < > 8.0 6.1 5.8 7.3 9.5  HGB 13.1   < > 12.8*   < > 12.4* 11.4* 11.0* 10.7* 10.7*  HCT 38.1*   < > 37.6*   < > 36.5* 33.5* 32.9* 31.9* 32.1*  PLT 130*   < > 194   < > 182 133* 134* 186 236  MCV 81.2   < > 81.2   < > 81.7 82.1 81.4 82.6 83.4  MCH 27.9   < > 27.6   < > 27.7 27.9 27.2 27.7 27.8  MCHC 34.4   < > 34.0   < > 34.0 34.0 33.4 33.5 33.3  RDW 12.8   < > 13.0   < > 13.0 13.1 13.1 13.2 13.1  LYMPHSABS 0.2*  --  0.7  --  0.2* 0.4* 0.3*  --   --   MONOABS 0.1  --  0.2  --  0.2 0.2 0.2  --   --   EOSABS 0.0  --  0.0  --  0.0 0.0 0.0  --   --   BASOSABS 0.0  --  0.0  --  0.0 0.0 0.0  --   --    < > = values in this interval not displayed.    Chemistries  Recent Labs  Lab 04/21/20 0025 04/22/20 0306 04/23/20 0028 04/24/20 0343 04/25/20 0310  NA 140 141 140 143 140  K 4.0 4.0 4.1 4.2 4.1  CL 106 109 109 111 111  CO2 20* 24 23 24 24   GLUCOSE 215* 227* 188* 198* 132*  BUN 36* 47* 40* 43* 38*  CREATININE 0.90 1.43* 1.06 1.07 0.92  CALCIUM 8.6* 8.5* 8.2* 8.1* 7.8*  MG  --   --   --  2.2 2.4  AST 37 94* 54* 36 32  ALT 25 71* 56* 42 37  ALKPHOS 38 48 46 40 39  BILITOT 0.6 0.5 0.6 0.4 0.5   ------------------------------------------------------------------------------------------------------------------ No results for input(s): CHOL, HDL, LDLCALC, TRIG, CHOLHDL, LDLDIRECT in the last 72 hours.  Lab Results  Component Value Date   HGBA1C 5.7 (H) 04/22/2020   ------------------------------------------------------------------------------------------------------------------ No results for input(s): TSH, T4TOTAL, T3FREE, THYROIDAB in the last 72 hours.  Invalid input(s):  FREET3 ------------------------------------------------------------------------------------------------------------------ No results for input(s): VITAMINB12, FOLATE, FERRITIN, TIBC, IRON, RETICCTPCT in the last 72 hours.  Coagulation profile No results for input(s): INR, PROTIME in the last 168 hours.  Recent Labs    04/24/20 0343 04/25/20 0310  DDIMER 17.48* 11.15*    Cardiac Enzymes No results for input(s): CKMB, TROPONINI, MYOGLOBIN in the last 168 hours.  Invalid input(s): CK ------------------------------------------------------------------------------------------------------------------    Component Value Date/Time   BNP 68.0 04/25/2020 0310    Micro Results Recent Results (from the past 240 hour(s))  Respiratory Panel by RT PCR (Flu A&B, Covid) - Nasopharyngeal Swab     Status: Abnormal   Collection Time: 04/18/20  6:09 PM   Specimen: Nasopharyngeal Swab  Result Value Ref Range Status   SARS  Coronavirus 2 by RT PCR POSITIVE (A) NEGATIVE Final    Comment: RESULT CALLED TO, READ BACK BY AND VERIFIED WITH: DR D RAY @2029  04/18/20 BY S GEZAHEGN (NOTE) SARS-CoV-2 target nucleic acids are DETECTED.  SARS-CoV-2 RNA is generally detectable in upper respiratory specimens  during the acute phase of infection. Positive results are indicative of the presence of the identified virus, but do not rule out bacterial infection or co-infection with other pathogens not detected by the test. Clinical correlation with patient history and other diagnostic information is necessary to determine patient infection status. The expected result is Negative.  Fact Sheet for Patients:  04/20/20  Fact Sheet for Healthcare Providers: https://www.moore.com/  This test is not yet approved or cleared by the https://www.young.biz/ FDA and  has been authorized for detection and/or diagnosis of SARS-CoV-2 by FDA under an Emergency Use Authorization  (EUA).  This EUA will remain in effect (meaning this test can  be used) for the duration of  the COVID-19 declaration under Section 564(b)(1) of the Act, 21 U.S.C. section 360bbb-3(b)(1), unless the authorization is terminated or revoked sooner.      Influenza A by PCR NEGATIVE NEGATIVE Final   Influenza B by PCR NEGATIVE NEGATIVE Final    Comment: (NOTE) The Xpert Xpress SARS-CoV-2/FLU/RSV assay is intended as an aid in  the diagnosis of influenza from Nasopharyngeal swab specimens and  should not be used as a sole basis for treatment. Nasal washings and  aspirates are unacceptable for Xpert Xpress SARS-CoV-2/FLU/RSV  testing.  Fact Sheet for Patients: [place]  Fact Sheet for Healthcare Providers: https://www.moore.com/  This test is not yet approved or cleared by the https://www.young.biz/ FDA and  has been authorized for detection and/or diagnosis of SARS-CoV-2 by  FDA under an Emergency Use Authorization (EUA). This EUA will remain  in effect (meaning this test can be used) for the duration of the  Covid-19 declaration under Section 564(b)(1) of the Act, 21  U.S.C. section 360bbb-3(b)(1), unless the authorization is  terminated or revoked. Performed at [facility], [address] [place], [place]   Blood Culture (routine x 2)     Status: None   Collection Time: 04/18/20  6:09 PM   Specimen: BLOOD  Result Value Ref Range Status   Specimen Description BLOOD RIGHT ANTECUBITAL  Final   Special Requests   Final    BOTTLES DRAWN AEROBIC AND ANAEROBIC Blood Culture adequate volume   Culture   Final    NO GROWTH 5 DAYS Performed at [facility], [address], [place], [place]    Report Status 04/23/2020 FINAL  Final  Blood Culture (routine x 2)     Status: None   Collection Time: 04/19/20  4:41 AM   Specimen: BLOOD  Result Value Ref Range Status   Specimen Description BLOOD LEFT ANTECUBITAL   Final   Special Requests   Final    BOTTLES DRAWN AEROBIC AND ANAEROBIC Blood Culture results may not be optimal due to an excessive volume of blood received in culture bottles   Culture   Final    NO GROWTH 5 DAYS Performed at [facility], [address] [place], [place]    Report Status 04/24/2020 FINAL  Final    Radiology Reports CT ANGIO CHEST PE W OR WO CONTRAST  Result Date: 04/19/2020 CLINICAL DATA:  Hypoxic, fever.  COVID-19 positive. EXAM: CT ANGIOGRAPHY CHEST WITH CONTRAST TECHNIQUE: Multidetector CT imaging of  the chest was performed using the standard protocol during bolus administration of intravenous contrast. Multiplanar CT image reconstructions and MIPs were obtained to evaluate the vascular anatomy. CONTRAST:  75mL OMNIPAQUE IOHEXOL 350 MG/ML SOLN COMPARISON:  None. FINDINGS: Cardiovascular: Satisfactory opacification of the pulmonary arteries to the segmental level. No evidence of pulmonary embolism. Normal heart size. No pericardial effusion. Mild coronary artery calcifications are noted. Mediastinum/Nodes: No enlarged mediastinal, hilar, or axillary lymph nodes. Thyroid gland, trachea, and esophagus demonstrate no significant findings. Lungs/Pleura: No pneumothorax or pleural effusion is noted. Bilateral multiple airspace opacities are noted consistent with multifocal pneumonia due to COVID-19. Upper Abdomen: No acute abnormality. Musculoskeletal: No chest wall abnormality. No acute or significant osseous findings. Review of the MIP images confirms the above findings. IMPRESSION: 1. No definite evidence of pulmonary embolus. 2. Mild coronary artery calcifications are noted suggesting coronary artery disease. 3. Bilateral multiple airspace opacities are noted consistent with multifocal pneumonia due to COVID-19. Electronically Signed   By: Lupita Raider M.D.   On: 04/19/2020 09:55   DG Chest Port 1 View  Result Date: 04/18/2020 CLINICAL DATA:  Shortness of  breath. EXAM: PORTABLE CHEST 1 VIEW COMPARISON:  None. FINDINGS: The heart is normal in size. There is soft tissue fullness in the left hilar region. Diffuse fine interstitial opacities, left greater than right and most prominent in the lower lung zones. There is slightly more patchy opacity at the left lung base. No pneumothorax or large pleural effusion. Biapical pleuroparenchymal scarring. No acute osseous abnormalities are seen. IMPRESSION: 1. Diffuse fine interstitial opacities, left greater than right and most prominent in the lower lung zones. Differential considerations favor infection, less likely pulmonary edema, neoplasm, or interstitial lung disease. 2. Soft tissue fullness in the left hilar region. Suspect underlying adenopathy. 3. Recommend clinical correlation. Radiographic follow-up is recommended after course of treatment. Should hilar fullness persist, recommend further characterization with chest CT. Electronically Signed   By: Narda Rutherford M.D.   On: 04/18/2020 18:40   VAS [place] LOWER EXTREMITY VENOUS (DVT)  Result Date: 04/20/2020  Lower Venous DVT Study Indications: Elevated d-dimer= 17.35.  Comparison Study: No prior study Performing Technologist: Gertie Fey MHA, RDMS, RVT, RDCS  Examination Guidelines: A complete evaluation includes B-mode imaging, spectral Doppler, color Doppler, and power Doppler as needed of all accessible portions of each vessel. Bilateral testing is considered an integral part of a complete examination. Limited examinations for reoccurring indications may be performed as noted. The reflux portion of the exam is performed with the patient in reverse Trendelenburg.  +---------+---------------+---------+-----------+----------+--------------+ RIGHT    CompressibilityPhasicitySpontaneityPropertiesThrombus Aging +---------+---------------+---------+-----------+----------+--------------+ CFV      Full           Yes      Yes                                  +---------+---------------+---------+-----------+----------+--------------+ SFJ      Full                                                        +---------+---------------+---------+-----------+----------+--------------+ FV Prox  Full                                                        +---------+---------------+---------+-----------+----------+--------------+  FV Mid   Full                                                        +---------+---------------+---------+-----------+----------+--------------+ FV DistalFull                                                        +---------+---------------+---------+-----------+----------+--------------+ PFV      Full                                                        +---------+---------------+---------+-----------+----------+--------------+ POP      Full           Yes      Yes                                 +---------+---------------+---------+-----------+----------+--------------+ PTV      Full                                                        +---------+---------------+---------+-----------+----------+--------------+ PERO     Full                                                        +---------+---------------+---------+-----------+----------+--------------+   +---------+---------------+---------+-----------+----------+--------------+ LEFT     CompressibilityPhasicitySpontaneityPropertiesThrombus Aging +---------+---------------+---------+-----------+----------+--------------+ CFV      Full           Yes      Yes                                 +---------+---------------+---------+-----------+----------+--------------+ SFJ      Full                                                        +---------+---------------+---------+-----------+----------+--------------+ FV Prox  Full                                                         +---------+---------------+---------+-----------+----------+--------------+ FV Mid   Full                                                        +---------+---------------+---------+-----------+----------+--------------+  FV DistalFull                                                        +---------+---------------+---------+-----------+----------+--------------+ PFV      Full                                                        +---------+---------------+---------+-----------+----------+--------------+ POP      Full           Yes      Yes                                 +---------+---------------+---------+-----------+----------+--------------+ PTV      Full                                                        +---------+---------------+---------+-----------+----------+--------------+ PERO     Full                                                        +---------+---------------+---------+-----------+----------+--------------+     Summary: RIGHT: - There is no evidence of deep vein thrombosis in the lower extremity.  - No cystic structure found in the popliteal fossa.  LEFT: - There is no evidence of deep vein thrombosis in the lower extremity.  - No cystic structure found in the popliteal fossa.  *See table(s) above for measurements and observations. Electronically signed by Waverly Ferrari MD on 04/20/2020 at 5:52:40 PM.    Final    ECHOCARDIOGRAM LIMITED  Result Date: 04/24/2020    ECHOCARDIOGRAM REPORT   Patient Name:   BERTIN INABINET Date of Exam: 04/24/2020 Medical Rec #:  [id number]       Height:       70.0 in Accession #:    [id number]      Weight:       135.0 lb Date of Birth:  1946/06/01       BSA:          1.766 m Patient Age:    74 years        BP:           154/82 mmHg Patient Gender: M               HR:           88 bpm. Exam Location:  Inpatient Procedure: Limited Color Doppler, Cardiac Doppler and Limited Echo Indications:    CHF-Acute Diastolic  428.31 / I50.31  History:        Patient has no prior history of Echocardiogram examinations.                 Risk Factors:Hypertension and Dyslipidemia. COVID 19 pneumonia.  Cough. Chest Pain.  Sonographer:    Leta Jungling RDCS Referring Phys: Effie Shy [place] [facility] IMPRESSIONS  1. Left ventricular ejection fraction, by estimation, is 65 to 70%. The left ventricle has normal function. The left ventricle has no regional wall motion abnormalities. There is mild concentric left ventricular hypertrophy. Left ventricular diastolic parameters are consistent with Grade I diastolic dysfunction (impaired relaxation). Elevated left atrial pressure.  2. Right ventricular systolic function is normal. The right ventricular size is normal.  3. The mitral valve is normal in structure. No evidence of mitral valve regurgitation. No evidence of mitral stenosis.  4. The aortic valve is normal in structure. Aortic valve regurgitation is not visualized. No aortic stenosis is present.  5. The inferior vena cava is normal in size with greater than 50% respiratory variability, suggesting right atrial pressure of 3 mmHg. FINDINGS  Left Ventricle: Left ventricular ejection fraction, by estimation, is 65 to 70%. The left ventricle has normal function. The left ventricle has no regional wall motion abnormalities. The left ventricular internal cavity size was normal in size. There is  mild concentric left ventricular hypertrophy. Left ventricular diastolic parameters are consistent with Grade I diastolic dysfunction (impaired relaxation). Elevated left atrial pressure. Right Ventricle: The right ventricular size is normal. No increase in right ventricular wall thickness. Right ventricular systolic function is normal. Left Atrium: Left atrial size was normal in size. Right Atrium: Right atrial size was normal in size. Pericardium: There is no evidence of pericardial effusion. Mitral Valve: The mitral valve is normal in  structure. No evidence of mitral valve regurgitation. No evidence of mitral valve stenosis. Tricuspid Valve: The tricuspid valve is normal in structure. Tricuspid valve regurgitation is not demonstrated. No evidence of tricuspid stenosis. Aortic Valve: The aortic valve is normal in structure. Aortic valve regurgitation is not visualized. No aortic stenosis is present. Pulmonic Valve: The pulmonic valve was normal in structure. Pulmonic valve regurgitation is not visualized. No evidence of pulmonic stenosis. Aorta: The aortic root is normal in size and structure. Venous: The inferior vena cava is normal in size with greater than 50% respiratory variability, suggesting right atrial pressure of 3 mmHg. IAS/Shunts: No atrial level shunt detected by color flow Doppler.  LEFT VENTRICLE PLAX 2D LVIDd:         3.40 cm  Diastology LVIDs:         2.30 cm  LV e' medial:    7.40 cm/s LV PW:         0.90 cm  LV E/e' medial:  13.0 LV IVS:        1.00 cm  LV e' lateral:   9.68 cm/s LVOT diam:     1.90 cm  LV E/e' lateral: 10.0 LV SV:         60 LV SV Index:   34 LVOT Area:     2.84 cm  LEFT ATRIUM           Index       RIGHT ATRIUM          Index LA diam:      3.10 cm 1.76 cm/m  RA Area:     8.50 cm LA Vol (A4C): 20.5 ml 11.61 ml/m RA Volume:   14.70 ml 8.32 ml/m  AORTIC VALVE LVOT Vmax:   131.00 cm/s LVOT Vmean:  72.400 cm/s LVOT VTI:    0.211 m  AORTA Ao Root diam: 2.80 cm MITRAL VALVE MV Area (PHT): 3.03 cm    SHUNTS  MV Decel Time: 250 msec    Systemic VTI:  0.21 m MV E velocity: 96.50 cm/s  Systemic Diam: 1.90 cm MV A velocity: 82.50 cm/s MV E/A ratio:  1.17 Tobias Alexander MD Electronically signed by Tobias Alexander MD Signature Date/Time: 04/24/2020/3:13:30 PM    Final

## 2020-04-26 ENCOUNTER — Inpatient Hospital Stay (HOSPITAL_COMMUNITY): Payer: Medicare Other

## 2020-04-26 LAB — COMPREHENSIVE METABOLIC PANEL
ALT: 36 U/L (ref 0–44)
AST: 38 U/L (ref 15–41)
Albumin: 2 g/dL — ABNORMAL LOW (ref 3.5–5.0)
Alkaline Phosphatase: 42 U/L (ref 38–126)
Anion gap: 7 (ref 5–15)
BUN: 27 mg/dL — ABNORMAL HIGH (ref 8–23)
CO2: 25 mmol/L (ref 22–32)
Calcium: 8 mg/dL — ABNORMAL LOW (ref 8.9–10.3)
Chloride: 107 mmol/L (ref 98–111)
Creatinine, Ser: 0.9 mg/dL (ref 0.61–1.24)
GFR, Estimated: >60 mL/min (ref >60)
Glucose, Bld: 104 mg/dL — ABNORMAL HIGH (ref 70–99)
Potassium: 4.1 mmol/L (ref 3.5–5.1)
Sodium: 139 mmol/L (ref 135–145)
Total Bilirubin: 0.7 mg/dL (ref 0.3–1.2)
Total Protein: 4.9 g/dL — ABNORMAL LOW (ref 6.5–8.1)

## 2020-04-26 LAB — MAGNESIUM: Magnesium: 2 mg/dL (ref 1.7–2.4)

## 2020-04-26 LAB — MRSA PCR SCREENING: MRSA by PCR: NEGATIVE

## 2020-04-26 LAB — CBC
HCT: 36.1 % — ABNORMAL LOW (ref 39.0–52.0)
Hemoglobin: 11.8 g/dL — ABNORMAL LOW (ref 13.0–17.0)
MCH: 27 pg (ref 26.0–34.0)
MCHC: 32.7 g/dL (ref 30.0–36.0)
MCV: 82.6 fL (ref 80.0–100.0)
Platelets: 233 10*3/uL (ref 150–400)
RBC: 4.37 MIL/uL (ref 4.22–5.81)
RDW: 13.1 % (ref 11.5–15.5)
WBC: 7.9 10*3/uL (ref 4.0–10.5)
nRBC: 0 % (ref 0.0–0.2)

## 2020-04-26 LAB — PROCALCITONIN: Procalcitonin: 2.8 ng/mL

## 2020-04-26 LAB — GLUCOSE, CAPILLARY
Glucose-Capillary: 84 mg/dL (ref 70–99)
Glucose-Capillary: 212 mg/dL — ABNORMAL HIGH (ref 70–99)
Glucose-Capillary: 155 mg/dL — ABNORMAL HIGH (ref 70–99)
Glucose-Capillary: 236 mg/dL — ABNORMAL HIGH (ref 70–99)

## 2020-04-26 LAB — D-DIMER, QUANTITATIVE: D-Dimer, Quant: 3.92 ug/mL-FEU — ABNORMAL HIGH (ref 0.00–0.50)

## 2020-04-26 LAB — BRAIN NATRIURETIC PEPTIDE: B Natriuretic Peptide: 49.5 pg/mL (ref 0.0–100.0)

## 2020-04-26 LAB — C-REACTIVE PROTEIN: CRP: 10.3 mg/dL — ABNORMAL HIGH (ref <1.0)

## 2020-04-26 MED ORDER — SODIUM CHLORIDE 0.9 % IV SOLN
3.0000 g | Freq: Three times a day (TID) | INTRAVENOUS | Status: DC
  Administered 2020-04-26 – 2020-04-27 (×3): 3 g via INTRAVENOUS
  Filled 2020-04-26: qty 8
  Filled 2020-04-26: qty 3
  Filled 2020-04-26 (×2): qty 8
  Filled 2020-04-26: qty 3
  Filled 2020-04-26: qty 8

## 2020-04-26 NOTE — Progress Notes (Signed)
Pharmacy Antibiotic Note  Trevor Martinez is a 74 y.o. male admitted on 04/18/2020 now with concern for aspiration pneumonia.  Pharmacy has been consulted for Unasyn dosing.  Plan: Unasyn 3 grams IV every 8 hours Monitor clinical progress, cultures/sensitivities, renal function, abx plan    Height: 5\' 10"  (177.8 cm) Weight: 61.2 kg (135 lb) IBW/kg (Calculated) : 73  Temp (24hrs), Avg:98.7 F (37.1 C), Min:98.4 F (36.9 C), Max:99.1 F (37.3 C)  Recent Labs  Lab 04/22/20 0306 04/23/20 0028 04/24/20 0343 04/25/20 0310 04/26/20 0344  WBC 6.1 5.8 7.3 9.5 7.9  CREATININE 1.43* 1.06 1.07 0.92 0.90    Estimated Creatinine Clearance: 63.3 mL/min (by C-G formula based on SCr of 0.9 mg/dL).    No Known Allergies  Antimicrobials this admission: 11/8 Unasyn>>  Remdesivir 10/31>11/4 Bari 11/1>11/13 - > 2 mg daily Ceftriaxone 10/31>>11/5 Azithromycin 10/31>11/3  Dose adjustments this admission:  Microbiology results: 11/1 BCx: ngf 10/31 BCx: ngf 10/31 COVID   Thank you for allowing 11/31 to participate in this patients care.   [place], PharmD Please see amion for complete clinical pharmacist phone list. 04/26/2020 11:27 AM

## 2020-04-26 NOTE — Progress Notes (Addendum)
PROGRESS NOTE                                                                                                                                                                                                             Patient Demographics:    Trevor Martinez, is a 74 y.o. male, DOB - 01/02/1946, KGM:[id number]  Outpatient Primary MD for the patient is Patient, No Pcp Per   Admit date - 04/18/2020   LOS - 8  Chief Complaint  Patient presents with  . Shortness of Breath       Brief Narrative: Patient is a 74 y.o. male with PMHx of HTN, HLD-who for the past 1 week or so-has been having cough, chest pain, diarrhea-neighbor called EMS-subsequent evaluation revealed acute hypoxic respiratory failure due to COVID-19 pneumonia  COVID-19 vaccinated status: Vaccinated  Significant Events: 11/1>> Admit to [facility] for hypoxia due to COVID-19 pneumonia  Significant studies: 10/31>>Chest x-ray: Interstitial opacities bilaterally, soft tissue fullness in the left hilar region. 11/1>> CTA chest: No PE, multifocal pneumonia, coronary artery calcifications.  No enlarged mediastinal lymph nodes. 11/2>> lower extremity Doppler: No DVT 11/6 TTE>>  Left ventricular ejection fraction, by estimation, is 65 to 70%. No WMA, Grade 1 Diastolic CHF    COVID-19 medications: Steroids: 10/31>> Remdesivir: 10/31>> 11/4 Baricitinib: 11/1>>  Antibiotics: Rocephin: 10/31>> Zithromax: 10/31>>11/2  Microbiology data: 10/31 >>blood culture:No growth  Procedures: None  Consults: None  DVT prophylaxis:  Lovenox at therapeutic dosing   Subjective:   Patient in bed, appears comfortable, denies any headache, no fever, no chest pain or pressure, no shortness of breath , no abdominal pain. No focal weakness.    Assessment  & Plan :   Acute Hypoxic Resp Failure due to Covid 19 Viral pneumonia and possible concurrent bacterial pneumonia: Hypoxia  slowly improving-remains on steroids and baricitinib.  Completed Remdesivir and Rocephin on 11/4.  Continue to titrate down steroids and oxygen, will require HHPT vs SNF upon discharge due to severe deconditioning.    Procalcitonin and CRP has bumped on 04/26/2020 - suspicious for aspiration pneumonia, speech eval, Unasyn, check MRSA PCR screen.  SpO2: 95 % O2 Flow Rate (L/min): 3 L/min  Prone/Incentive Spirometry: encouraged  incentive spirometry use 3-4/hour.  Recent Labs  Lab 04/21/20 0025 04/21/20 0025 04/22/20 6440 04/23/20 0028 04/24/20 3474 04/25/20 0310 04/26/20 2595  WBC 8.0   < > 6.1 5.8 7.3 9.5 7.9  HGB 12.4*   < > 11.4* 11.0* 10.7* 10.7* 11.8*  HCT 36.5*   < > 33.5* 32.9* 31.9* 32.1* 36.1*  PLT 182   < > 133* 134* 186 236 233  CRP 9.6*   < > 5.6* 3.1* 1.7* 0.9 10.3*  BNP  --   --   --   --   --  68.0 49.5  DDIMER >20.00*   < > >20.00* >20.00* 17.48* 11.15* 3.92*  PROCALCITON 1.34  --   --   --   --   --  2.80  AST 37   < > 94* 54* 36 32 38  ALT 25   < > 71* 56* 42 37 36  ALKPHOS 38   < > 48 46 40 39 42  BILITOT 0.6   < > 0.5 0.6 0.4 0.5 0.7  ALBUMIN 2.3*   < > 2.2* 2.2* 2.1* 2.0* 2.0*   < > = values in this interval not displayed.     Asymptomatic 15 beat run of V. tach on 04/24/2020.  He has been switched from Norvasc to Coreg, electrolytes stable, symptom-free, echo shows a preserved EF without any wall motion abnormality.  No further work-up.  Significantly elevated D-dimer: D-dimer continues to be significantly elevated-Doppler/CTA chest negative for VTE-continue therapeutic dosing of Lovenox until D-dimer starts trending down. 2 weeks Eliquis upon DC.  Transaminitis: Mild-likely secondary to COVID-19-stable for follow-up.  LFTs trending down towards normal.  HTN: Continue Coreg, Norvasc-  HCTZ and lisinopril combination on hold.  HLD: Continue statin   Debility/deconditioning: Secondary to COVID-19-rehab services recommending SNF at  discharge.  Steroid-induced hyperglycemia: CBGs stable with SSI-A1c stable at 5.7. Steroids being tapered off.  Recent Labs    04/23/20 1622 04/23/20 2029 04/24/20 0732  GLUCAP 181* 250* 157*   Lab Results  Component Value Date   HGBA1C 5.7 (H) 04/22/2020       Condition - Fair  Family Communication  : previous MD Spoke with sister-Francis Janee Morn [phone]) over the phone on 11/4.  Code Status :  Full Code  Diet :  Diet Order            Diet Heart Room service appropriate? Yes; Fluid consistency: Thin  Diet effective now                  Disposition Plan  :   Status is: Inpatient  Remains inpatient appropriate because:Inpatient level of care appropriate due to severity of illness   Dispo: The patient is from: Home              Anticipated d/c is to: SNF.              Anticipated d/c date is: > 3 days              Patient currently is not medically stable to d/c.    Barriers to discharge: Hypoxia requiring O2 supplementation  Antimicorbials  :    Anti-infectives (From admission, onward)   Start     Dose/Rate Route Frequency Ordered Stop   04/19/20 2200  cefTRIAXone (ROCEPHIN) 2 g in sodium chloride 0.9 % 100 mL IVPB        2 g 200 mL/hr over 30 Minutes Intravenous Every 24 hours 04/18/20 2220 04/23/20 2212   04/19/20 1000  remdesivir 100 mg in sodium chloride 0.9 % 100 mL IVPB       "Followed by"  Linked Group Details   100 mg 200 mL/hr over 30 Minutes Intravenous Daily 04/18/20 2137 04/22/20 0917   04/19/20 0000  azithromycin (ZITHROMAX) 500 mg in sodium chloride 0.9 % 250 mL IVPB        500 mg 250 mL/hr over 60 Minutes Intravenous Every 24 hours 04/18/20 2220 04/21/20 0200   04/18/20 2300  remdesivir 200 mg in sodium chloride 0.9% 250 mL IVPB       "Followed by" Linked Group Details   200 mg 580 mL/hr over 30 Minutes Intravenous Once 04/18/20 2137 04/19/20 0003   04/18/20 2300  cefTRIAXone (ROCEPHIN) 2 g in sodium chloride 0.9 % 100 mL IVPB         2 g 200 mL/hr over 30 Minutes Intravenous  Once 04/18/20 2258 04/19/20 0001      Inpatient Medications  Scheduled Meds: . vitamin C  500 mg Oral Daily  . baricitinib  4 mg Oral Daily  . benzonatate  200 mg Oral TID  . carvedilol  3.125 mg Oral BID WC  . enoxaparin (LOVENOX) injection  60 mg Subcutaneous Q12H  . insulin aspart  0-9 Units Subcutaneous TID WC  . methylPREDNISolone (SOLU-MEDROL) injection  40 mg Intravenous Daily  . pravastatin  40 mg Oral Q supper  . zinc sulfate  220 mg Oral Daily   Continuous Infusions:  PRN Meds:.acetaminophen **OR** [DISCONTINUED] acetaminophen, albuterol   Time Spent in minutes  25   See all Orders from today for further details   Susa RaringPrashant Kyndle Schlender M.D on 04/26/2020 at 11:12 AM  To page go to [email or web address] - use universal password  [facility] -  Office  [phone]    Objective:   Vitals:   04/25/20 2037 04/26/20 0434 04/26/20 0446 04/26/20 0914  BP: 126/70  127/66 (!) 118/54  Pulse: 69  65 90  Resp: 16  18   Temp: 98.7 F (37.1 C)  98.4 F (36.9 C)   TempSrc: Oral     SpO2: 99% 92% 95%   Weight:      Height:        Wt Readings from Last 3 Encounters:  04/18/20 61.2 kg     Intake/Output Summary (Last 24 hours) at 04/26/2020 1112 Last data filed at 04/26/2020 0907 Gross per 24 hour  Intake 480 ml  Output 1250 ml  Net -770 ml     Physical Exam  Awake Alert, No new F.N deficits, Normal affect Terral.AT,PERRAL Supple Neck,No JVD, No cervical lymphadenopathy appriciated.  Symmetrical Chest wall movement, Good air movement bilaterally, CTAB RRR,No Gallops, Rubs or new Murmurs, No Parasternal Heave +ve B.Sounds, Abd Soft, No tenderness, No organomegaly appriciated, No rebound - guarding or rigidity. No Cyanosis, Clubbing or edema, No new Rash or bruise     Data Review:    CBC Recent Labs  Lab 04/20/20 0808 04/20/20 0808 04/21/20 0025 04/21/20 0025 04/22/20 0306 04/23/20 0028 04/24/20 0343  04/25/20 0310 04/26/20 0344  WBC 7.0   < > 8.0   < > 6.1 5.8 7.3 9.5 7.9  HGB 12.8*   < > 12.4*   < > 11.4* 11.0* 10.7* 10.7* 11.8*  HCT 37.6*   < > 36.5*   < > 33.5* 32.9* 31.9* 32.1* 36.1*  PLT 194   < > 182   < > 133* 134* 186 236 233  MCV 81.2   < > 81.7   < > 82.1 81.4 82.6 83.4 82.6  MCH 27.6   < > 27.7   < >  27.9 27.2 27.7 27.8 27.0  MCHC 34.0   < > 34.0   < > 34.0 33.4 33.5 33.3 32.7  RDW 13.0   < > 13.0   < > 13.1 13.1 13.2 13.1 13.1  LYMPHSABS 0.7  --  0.2*  --  0.4* 0.3*  --   --   --   MONOABS 0.2  --  0.2  --  0.2 0.2  --   --   --   EOSABS 0.0  --  0.0  --  0.0 0.0  --   --   --   BASOSABS 0.0  --  0.0  --  0.0 0.0  --   --   --    < > = values in this interval not displayed.    Chemistries  Recent Labs  Lab 04/22/20 0306 04/23/20 0028 04/24/20 0343 04/25/20 0310 04/26/20 0344  NA 141 140 143 140 139  K 4.0 4.1 4.2 4.1 4.1  CL 109 109 111 111 107  CO2 24 23 24 24 25   GLUCOSE 227* 188* 198* 132* 104*  BUN 47* 40* 43* 38* 27*  CREATININE 1.43* 1.06 1.07 0.92 0.90  CALCIUM 8.5* 8.2* 8.1* 7.8* 8.0*  MG  --   --  2.2 2.4 2.0  AST 94* 54* 36 32 38  ALT 71* 56* 42 37 36  ALKPHOS 48 46 40 39 42  BILITOT 0.5 0.6 0.4 0.5 0.7   ------------------------------------------------------------------------------------------------------------------ No results for input(s): CHOL, HDL, LDLCALC, TRIG, CHOLHDL, LDLDIRECT in the last 72 hours.  Lab Results  Component Value Date   HGBA1C 5.7 (H) 04/22/2020   ------------------------------------------------------------------------------------------------------------------ No results for input(s): TSH, T4TOTAL, T3FREE, THYROIDAB in the last 72 hours.  Invalid input(s): FREET3 ------------------------------------------------------------------------------------------------------------------ No results for input(s): VITAMINB12, FOLATE, FERRITIN, TIBC, IRON, RETICCTPCT in the last 72 hours.  Coagulation profile No results for  input(s): INR, PROTIME in the last 168 hours.  Recent Labs    04/25/20 0310 04/26/20 0344  DDIMER 11.15* 3.92*    Cardiac Enzymes No results for input(s): CKMB, TROPONINI, MYOGLOBIN in the last 168 hours.  Invalid input(s): CK ------------------------------------------------------------------------------------------------------------------    Component Value Date/Time   BNP 49.5 04/26/2020 0344    Micro Results Recent Results (from the past 240 hour(s))  Respiratory Panel by RT PCR (Flu A&B, Covid) - Nasopharyngeal Swab     Status: Abnormal   Collection Time: 04/18/20  6:09 PM   Specimen: Nasopharyngeal Swab  Result Value Ref Range Status   SARS Coronavirus 2 by RT PCR POSITIVE (A) NEGATIVE Final    Comment: RESULT CALLED TO, READ BACK BY AND VERIFIED WITH: DR D RAY @2029  04/18/20 BY S GEZAHEGN (NOTE) SARS-CoV-2 target nucleic acids are DETECTED.  SARS-CoV-2 RNA is generally detectable in upper respiratory specimens  during the acute phase of infection. Positive results are indicative of the presence of the identified virus, but do not rule out bacterial infection or co-infection with other pathogens not detected by the test. Clinical correlation with patient history and other diagnostic information is necessary to determine patient infection status. The expected result is Negative.  Fact Sheet for Patients:  https://www.moore.com/  Fact Sheet for Healthcare Providers: https://www.young.biz/  This test is not yet approved or cleared by the [place] FDA and  has been authorized for detection and/or diagnosis of SARS-CoV-2 by FDA under an Emergency Use Authorization (EUA).  This EUA will remain in effect (meaning this test can  be used) for the duration of  the  COVID-19 declaration under Section 564(b)(1) of the Act, 21 U.S.C. section 360bbb-3(b)(1), unless the authorization is terminated or revoked sooner.      Influenza  A by PCR NEGATIVE NEGATIVE Final   Influenza B by PCR NEGATIVE NEGATIVE Final    Comment: (NOTE) The Xpert Xpress SARS-CoV-2/FLU/RSV assay is intended as an aid in  the diagnosis of influenza from Nasopharyngeal swab specimens and  should not be used as a sole basis for treatment. Nasal washings and  aspirates are unacceptable for Xpert Xpress SARS-CoV-2/FLU/RSV  testing.  Fact Sheet for Patients: https://www.moore.com/  Fact Sheet for Healthcare Providers: https://www.young.biz/  This test is not yet approved or cleared by the [place] FDA and  has been authorized for detection and/or diagnosis of SARS-CoV-2 by  FDA under an Emergency Use Authorization (EUA). This EUA will remain  in effect (meaning this test can be used) for the duration of the  Covid-19 declaration under Section 564(b)(1) of the Act, 21  U.S.C. section 360bbb-3(b)(1), unless the authorization is  terminated or revoked. Performed at [facility], [address], [place], [place] [address]   Blood Culture (routine x 2)     Status: None   Collection Time: 04/18/20  6:09 PM   Specimen: BLOOD  Result Value Ref Range Status   Specimen Description BLOOD RIGHT ANTECUBITAL  Final   Special Requests   Final    BOTTLES DRAWN AEROBIC AND ANAEROBIC Blood Culture adequate volume   Culture   Final    NO GROWTH 5 DAYS Performed at [facility], [address] [place], [place] [address]    Report Status 04/23/2020 FINAL  Final  Blood Culture (routine x 2)     Status: None   Collection Time: 04/19/20  4:41 AM   Specimen: BLOOD  Result Value Ref Range Status   Specimen Description BLOOD LEFT ANTECUBITAL  Final   Special Requests   Final    BOTTLES DRAWN AEROBIC AND ANAEROBIC Blood Culture results may not be optimal due to an excessive volume of blood received in culture bottles   Culture   Final    NO GROWTH 5 DAYS Performed at [facility], [address], [place], [place] [address]    Report Status 04/24/2020 FINAL  Final    Radiology Reports CT ANGIO CHEST PE W OR WO CONTRAST  Result Date: 04/19/2020 CLINICAL DATA:  Hypoxic, fever.  COVID-19 positive. EXAM: CT ANGIOGRAPHY CHEST WITH CONTRAST TECHNIQUE: Multidetector CT imaging of the chest was performed using the standard protocol during bolus administration of intravenous contrast. Multiplanar CT image reconstructions and MIPs were obtained to evaluate the vascular anatomy. CONTRAST:  75mL OMNIPAQUE IOHEXOL 350 MG/ML SOLN COMPARISON:  None. FINDINGS: Cardiovascular: Satisfactory opacification of the pulmonary arteries to the segmental level. No evidence of pulmonary embolism. Normal heart size. No pericardial effusion. Mild coronary artery calcifications are noted. Mediastinum/Nodes: No enlarged mediastinal, hilar, or axillary lymph nodes. Thyroid gland, trachea, and esophagus demonstrate no significant findings. Lungs/Pleura: No pneumothorax or pleural effusion is noted. Bilateral multiple airspace opacities are noted consistent with multifocal pneumonia due to COVID-19. Upper Abdomen: No acute abnormality. Musculoskeletal: No chest wall abnormality. No acute or significant osseous findings. Review of the MIP images confirms the above findings. IMPRESSION: 1. No definite evidence of pulmonary embolus. 2. Mild coronary artery calcifications are noted suggesting coronary artery disease. 3. Bilateral multiple airspace opacities are noted consistent with multifocal pneumonia due to COVID-19. Electronically Signed   By: Fayrene Fearing  Christen Butter M.D.   On: 04/19/2020 09:55   DG Chest Port 1 View  Result Date: 04/26/2020 CLINICAL DATA:  Shortness of breath. EXAM: PORTABLE CHEST 1 VIEW COMPARISON:  April 18, 2020. FINDINGS: Stable cardiomediastinal silhouette. No pneumothorax is noted. Increased bilateral lung opacities are noted, left greater than right, concerning for multifocal pneumonia. No significant effusion is  noted. Bony thorax is unremarkable. IMPRESSION: Increased bilateral lung opacities are noted, left greater than right, concerning for multifocal pneumonia. Electronically Signed   By: Lupita Raider M.D.   On: 04/26/2020 08:02   DG Chest Port 1 View  Result Date: 04/18/2020 CLINICAL DATA:  Shortness of breath. EXAM: PORTABLE CHEST 1 VIEW COMPARISON:  None. FINDINGS: The heart is normal in size. There is soft tissue fullness in the left hilar region. Diffuse fine interstitial opacities, left greater than right and most prominent in the lower lung zones. There is slightly more patchy opacity at the left lung base. No pneumothorax or large pleural effusion. Biapical pleuroparenchymal scarring. No acute osseous abnormalities are seen. IMPRESSION: 1. Diffuse fine interstitial opacities, left greater than right and most prominent in the lower lung zones. Differential considerations favor infection, less likely pulmonary edema, neoplasm, or interstitial lung disease. 2. Soft tissue fullness in the left hilar region. Suspect underlying adenopathy. 3. Recommend clinical correlation. Radiographic follow-up is recommended after course of treatment. Should hilar fullness persist, recommend further characterization with chest CT. Electronically Signed   By: Narda Rutherford M.D.   On: 04/18/2020 18:40   VAS [place] LOWER EXTREMITY VENOUS (DVT)  Result Date: 04/20/2020  Lower Venous DVT Study Indications: Elevated d-dimer= 17.35.  Comparison Study: No prior study Performing Technologist: Gertie Fey MHA, RDMS, RVT, RDCS  Examination Guidelines: A complete evaluation includes B-mode imaging, spectral Doppler, color Doppler, and power Doppler as needed of all accessible portions of each vessel. Bilateral testing is considered an integral part of a complete examination. Limited examinations for reoccurring indications may be performed as noted. The reflux portion of the exam is performed with the patient in reverse  Trendelenburg.  +---------+---------------+---------+-----------+----------+--------------+ RIGHT    CompressibilityPhasicitySpontaneityPropertiesThrombus Aging +---------+---------------+---------+-----------+----------+--------------+ CFV      Full           Yes      Yes                                 +---------+---------------+---------+-----------+----------+--------------+ SFJ      Full                                                        +---------+---------------+---------+-----------+----------+--------------+ FV Prox  Full                                                        +---------+---------------+---------+-----------+----------+--------------+ FV Mid   Full                                                        +---------+---------------+---------+-----------+----------+--------------+  FV DistalFull                                                        +---------+---------------+---------+-----------+----------+--------------+ PFV      Full                                                        +---------+---------------+---------+-----------+----------+--------------+ POP      Full           Yes      Yes                                 +---------+---------------+---------+-----------+----------+--------------+ PTV      Full                                                        +---------+---------------+---------+-----------+----------+--------------+ PERO     Full                                                        +---------+---------------+---------+-----------+----------+--------------+   +---------+---------------+---------+-----------+----------+--------------+ LEFT     CompressibilityPhasicitySpontaneityPropertiesThrombus Aging +---------+---------------+---------+-----------+----------+--------------+ CFV      Full           Yes      Yes                                  +---------+---------------+---------+-----------+----------+--------------+ SFJ      Full                                                        +---------+---------------+---------+-----------+----------+--------------+ FV Prox  Full                                                        +---------+---------------+---------+-----------+----------+--------------+ FV Mid   Full                                                        +---------+---------------+---------+-----------+----------+--------------+ FV DistalFull                                                        +---------+---------------+---------+-----------+----------+--------------+  PFV      Full                                                        +---------+---------------+---------+-----------+----------+--------------+ POP      Full           Yes      Yes                                 +---------+---------------+---------+-----------+----------+--------------+ PTV      Full                                                        +---------+---------------+---------+-----------+----------+--------------+ PERO     Full                                                        +---------+---------------+---------+-----------+----------+--------------+     Summary: RIGHT: - There is no evidence of deep vein thrombosis in the lower extremity.  - No cystic structure found in the popliteal fossa.  LEFT: - There is no evidence of deep vein thrombosis in the lower extremity.  - No cystic structure found in the popliteal fossa.  *See table(s) above for measurements and observations. Electronically signed by Waverly Ferrari MD on 04/20/2020 at 5:52:40 PM.    Final    ECHOCARDIOGRAM LIMITED  Result Date: 04/24/2020    ECHOCARDIOGRAM REPORT   Patient Name:   HERSHALL BENKERT Date of Exam: 04/24/2020 Medical Rec #:  [id number]       Height:       70.0 in Accession #:    [id number]      Weight:        135.0 lb Date of Birth:  05/26/1946       BSA:          1.766 m Patient Age:    74 years        BP:           154/82 mmHg Patient Gender: M               HR:           88 bpm. Exam Location:  Inpatient Procedure: Limited Color Doppler, Cardiac Doppler and Limited Echo Indications:    CHF-Acute Diastolic 428.31 / I50.31  History:        Patient has no prior history of Echocardiogram examinations.                 Risk Factors:Hypertension and Dyslipidemia. COVID 19 pneumonia.                 Cough. Chest Pain.  Sonographer:    Leta Jungling RDCS Referring Phys: Effie Shy [facility] IMPRESSIONS  1. Left ventricular ejection fraction, by estimation, is 65 to 70%. The left ventricle has normal function. The left ventricle has no regional wall motion abnormalities. There is mild concentric left ventricular hypertrophy.  Left ventricular diastolic parameters are consistent with Grade I diastolic dysfunction (impaired relaxation). Elevated left atrial pressure.  2. Right ventricular systolic function is normal. The right ventricular size is normal.  3. The mitral valve is normal in structure. No evidence of mitral valve regurgitation. No evidence of mitral stenosis.  4. The aortic valve is normal in structure. Aortic valve regurgitation is not visualized. No aortic stenosis is present.  5. The inferior vena cava is normal in size with greater than 50% respiratory variability, suggesting right atrial pressure of 3 mmHg. FINDINGS  Left Ventricle: Left ventricular ejection fraction, by estimation, is 65 to 70%. The left ventricle has normal function. The left ventricle has no regional wall motion abnormalities. The left ventricular internal cavity size was normal in size. There is  mild concentric left ventricular hypertrophy. Left ventricular diastolic parameters are consistent with Grade I diastolic dysfunction (impaired relaxation). Elevated left atrial pressure. Right Ventricle: The right ventricular size is normal. No  increase in right ventricular wall thickness. Right ventricular systolic function is normal. Left Atrium: Left atrial size was normal in size. Right Atrium: Right atrial size was normal in size. Pericardium: There is no evidence of pericardial effusion. Mitral Valve: The mitral valve is normal in structure. No evidence of mitral valve regurgitation. No evidence of mitral valve stenosis. Tricuspid Valve: The tricuspid valve is normal in structure. Tricuspid valve regurgitation is not demonstrated. No evidence of tricuspid stenosis. Aortic Valve: The aortic valve is normal in structure. Aortic valve regurgitation is not visualized. No aortic stenosis is present. Pulmonic Valve: The pulmonic valve was normal in structure. Pulmonic valve regurgitation is not visualized. No evidence of pulmonic stenosis. Aorta: The aortic root is normal in size and structure. Venous: The inferior vena cava is normal in size with greater than 50% respiratory variability, suggesting right atrial pressure of 3 mmHg. IAS/Shunts: No atrial level shunt detected by color flow Doppler.  LEFT VENTRICLE PLAX 2D LVIDd:         3.40 cm  Diastology LVIDs:         2.30 cm  LV e' medial:    7.40 cm/s LV PW:         0.90 cm  LV E/e' medial:  13.0 LV IVS:        1.00 cm  LV e' lateral:   9.68 cm/s LVOT diam:     1.90 cm  LV E/e' lateral: 10.0 LV SV:         60 LV SV Index:   34 LVOT Area:     2.84 cm  LEFT ATRIUM           Index       RIGHT ATRIUM          Index LA diam:      3.10 cm 1.76 cm/m  RA Area:     8.50 cm LA Vol (A4C): 20.5 ml 11.61 ml/m RA Volume:   14.70 ml 8.32 ml/m  AORTIC VALVE LVOT Vmax:   131.00 cm/s LVOT Vmean:  72.400 cm/s LVOT VTI:    0.211 m  AORTA Ao Root diam: 2.80 cm MITRAL VALVE MV Area (PHT): 3.03 cm    SHUNTS MV Decel Time: 250 msec    Systemic VTI:  0.21 m MV E velocity: 96.50 cm/s  Systemic Diam: 1.90 cm MV A velocity: 82.50 cm/s MV E/A ratio:  1.17 Tobias Alexander MD Electronically signed by Tobias Alexander MD  Signature Date/Time: 04/24/2020/3:13:30 PM    Final

## 2020-04-26 NOTE — Plan of Care (Signed)
  Problem: Education: Goal: Knowledge of General Education information will improve Description: Including pain rating scale, medication(s)/side effects and non-pharmacologic comfort measures Outcome: Progressing   Problem: Clinical Measurements: Goal: Ability to maintain clinical measurements within normal limits will improve Outcome: Progressing Goal: Will remain free from infection Outcome: Progressing Goal: Diagnostic test results will improve Outcome: Progressing Goal: Respiratory complications will improve Outcome: Progressing Goal: Cardiovascular complication will be avoided Outcome: Progressing   Problem: Activity: Goal: Risk for activity intolerance will decrease Outcome: Progressing   Problem: Nutrition: Goal: Adequate nutrition will be maintained Outcome: Progressing   Problem: Coping: Goal: Level of anxiety will decrease Outcome: Progressing   Problem: Elimination: Goal: Will not experience complications related to bowel motility Outcome: Progressing Goal: Will not experience complications related to urinary retention Outcome: Progressing   Problem: Pain Managment: Goal: General experience of comfort will improve Outcome: Progressing   Problem: Safety: Goal: Ability to remain free from injury will improve Outcome: Progressing   Problem: Skin Integrity: Goal: Risk for impaired skin integrity will decrease Outcome: Progressing   Problem: Education: Goal: Knowledge of risk factors and measures for prevention of condition will improve Outcome: Progressing   Problem: Coping: Goal: Psychosocial and spiritual needs will be supported Outcome: Progressing   Problem: Respiratory: Goal: Will maintain a patent airway Outcome: Progressing Goal: Complications related to the disease process, condition or treatment will be avoided or minimized Outcome: Progressing   Problem: Health Behavior/Discharge Planning: Goal: Ability to manage health-related needs will  improve Outcome: Not Progressing

## 2020-04-26 NOTE — Progress Notes (Signed)
Physical Therapy Treatment Patient Details Name: Trevor Martinez MRN: [id number] DOB: 30-Dec-1945 Today's Date: 04/26/2020    History of Present Illness 74 y.o. male with history of hypertension and hyperlipidemia was brought to the ER after patient was found to be hypoxic febrile and was having some diarrhea and feeling weak. Patient's neighbor called in for a wellness check since patient was not seen outside the house for the last 2 weeks. +COVID    PT Comments    Patient up in recliner on arrival: on 5L sats 86% HR 84. As preparing to walk, increased to 6L sats 90% HR 90 at rest. Patient self-selects appropriate slower speed for walking. Cuing required for safe use of RW (especially with regards to turning--including managing O2 tubing to simulate home O2).    Walking 6L lowest 83% 6L seated rest down to 79%, however after 1 min up to 88% HR 94 decr to 5L +coughing with sats 86-88% HR 88    Follow Up Recommendations  SNF;Supervision/Assistance - 24 hour     Equipment Recommendations  Other (comment) (TBD next venue)    Recommendations for Other Services       Precautions / Restrictions Precautions Precautions: Fall    Mobility  Bed Mobility                  Transfers Overall transfer level: Needs assistance Equipment used: Rolling walker (2 wheeled) Transfers: Sit to/from Stand Sit to Stand: Supervision         General transfer comment: Supervision for cuing for safest use of RW  Ambulation/Gait Ambulation/Gait assistance: Min assist Gait Distance (Feet): 60 Feet Assistive device: Rolling walker (2 wheeled) Gait Pattern/deviations: Decreased stride length;Step-to pattern;Trunk flexed Gait velocity: decr   General Gait Details: self-selects very slow pace (although improved from last PT visit). He stated he uses either the RW or the rollator at home and wanted to use the RW today.    Stairs             Wheelchair Mobility    Modified Rankin  (Stroke Patients Only)       Balance Overall balance assessment: Needs assistance Sitting-balance support: No upper extremity supported;Feet supported Sitting balance-Leahy Scale: Good     Standing balance support: Bilateral upper extremity supported Standing balance-Leahy Scale: Poor Standing balance comment: needed external support                            Cognition Arousal/Alertness: Awake/alert Behavior During Therapy: WFL for tasks assessed/performed Overall Cognitive Status: No family/caregiver present to determine baseline cognitive functioning Area of Impairment: Following commands                   Current Attention Level: Sustained   Following Commands: Follows one step commands with increased time       General Comments: Requiring increased time, but feel it is mostly due to [name]. Very pleasant and agreeable.       Exercises      General Comments        Pertinent Vitals/Pain Pain Assessment: No/denies pain    Home Living                      Prior Function            PT Goals (current goals can now be found in the care plan section) Acute Rehab PT Goals Patient Stated Goal: agrees wants to  get stronger Time For Goal Achievement: 05/03/20 Potential to Achieve Goals: Good Progress towards PT goals: Progressing toward goals    Frequency    Min 2X/week      PT Plan Current plan remains appropriate    Co-evaluation              AM-PAC PT "6 Clicks" Mobility   Outcome Measure  Help needed turning from your back to your side while in a flat bed without using bedrails?: None Help needed moving from lying on your back to sitting on the side of a flat bed without using bedrails?: None Help needed moving to and from a bed to a chair (including a wheelchair)?: A Little Help needed standing up from a chair using your arms (e.g., wheelchair or bedside chair)?: A Little Help needed to walk in hospital room?: A  Little Help needed climbing 3-5 steps with a railing? : A Little 6 Click Score: 20    End of Session Equipment Utilized During Treatment: Gait belt;Oxygen Activity Tolerance: Patient tolerated treatment well (moving very slowly to minimize coughing) Patient left: in chair;with call bell/phone within reach;with chair alarm set   PT Visit Diagnosis: Unsteadiness on feet (R26.81);Muscle weakness (generalized) (M62.81)     Time: 7169-6789 PT Time Calculation (min) (ACUTE ONLY): 18 min  Charges:  $Gait Training: 8-22 mins                      Trevor Martinez, PT Pager [phone]    Trevor Martinez 04/26/2020, 12:07 PM

## 2020-04-27 ENCOUNTER — Other Ambulatory Visit (HOSPITAL_COMMUNITY): Payer: Self-pay | Admitting: Internal Medicine

## 2020-04-27 LAB — COMPREHENSIVE METABOLIC PANEL
ALT: 31 U/L (ref 0–44)
AST: 26 U/L (ref 15–41)
Albumin: 1.9 g/dL — ABNORMAL LOW (ref 3.5–5.0)
Alkaline Phosphatase: 44 U/L (ref 38–126)
Anion gap: 5 (ref 5–15)
BUN: 30 mg/dL — ABNORMAL HIGH (ref 8–23)
CO2: 25 mmol/L (ref 22–32)
Calcium: 7.9 mg/dL — ABNORMAL LOW (ref 8.9–10.3)
Chloride: 108 mmol/L (ref 98–111)
Creatinine, Ser: 0.92 mg/dL (ref 0.61–1.24)
GFR, Estimated: >60 mL/min (ref >60)
Glucose, Bld: 177 mg/dL — ABNORMAL HIGH (ref 70–99)
Potassium: 4.2 mmol/L (ref 3.5–5.1)
Sodium: 138 mmol/L (ref 135–145)
Total Bilirubin: 0.8 mg/dL (ref 0.3–1.2)
Total Protein: 4.9 g/dL — ABNORMAL LOW (ref 6.5–8.1)

## 2020-04-27 LAB — CBC
HCT: 32.7 % — ABNORMAL LOW (ref 39.0–52.0)
Hemoglobin: 10.9 g/dL — ABNORMAL LOW (ref 13.0–17.0)
MCH: 27.6 pg (ref 26.0–34.0)
MCHC: 33.3 g/dL (ref 30.0–36.0)
MCV: 82.8 fL (ref 80.0–100.0)
Platelets: 247 10*3/uL (ref 150–400)
RBC: 3.95 MIL/uL — ABNORMAL LOW (ref 4.22–5.81)
RDW: 12.9 % (ref 11.5–15.5)
WBC: 7.2 10*3/uL (ref 4.0–10.5)
nRBC: 0 % (ref 0.0–0.2)

## 2020-04-27 LAB — MAGNESIUM: Magnesium: 2.1 mg/dL (ref 1.7–2.4)

## 2020-04-27 LAB — BRAIN NATRIURETIC PEPTIDE: B Natriuretic Peptide: 47.7 pg/mL (ref 0.0–100.0)

## 2020-04-27 LAB — GLUCOSE, CAPILLARY
Glucose-Capillary: 132 mg/dL — ABNORMAL HIGH (ref 70–99)
Glucose-Capillary: 204 mg/dL — ABNORMAL HIGH (ref 70–99)

## 2020-04-27 LAB — D-DIMER, QUANTITATIVE: D-Dimer, Quant: 2.34 ug/mL-FEU — ABNORMAL HIGH (ref 0.00–0.50)

## 2020-04-27 LAB — PROCALCITONIN: Procalcitonin: 1.75 ng/mL

## 2020-04-27 LAB — C-REACTIVE PROTEIN: CRP: 7.6 mg/dL — ABNORMAL HIGH (ref <1.0)

## 2020-04-27 MED ORDER — ALBUTEROL SULFATE HFA 108 (90 BASE) MCG/ACT IN AERS: 2.0000 | INHALATION_SPRAY | RESPIRATORY_TRACT | 0 refills | Status: DC | PRN

## 2020-04-27 MED ORDER — APIXABAN 2.5 MG PO TABS: 2.5000 mg | ORAL_TABLET | Freq: Two times a day (BID) | ORAL | 0 refills | Status: DC

## 2020-04-27 MED ORDER — METHYLPREDNISOLONE 4 MG PO TBPK: ORAL_TABLET | ORAL | 0 refills | Status: DC

## 2020-04-27 MED ORDER — ASPIRIN EC 81 MG PO TBEC: 81.0000 mg | DELAYED_RELEASE_TABLET | Freq: Every day | ORAL | 0 refills | Status: DC

## 2020-04-27 MED ORDER — AMOXICILLIN-POT CLAVULANATE 875-125 MG PO TABS: 1.0000 | ORAL_TABLET | Freq: Two times a day (BID) | ORAL | 0 refills | Status: DC

## 2020-04-27 MED ORDER — PRAVASTATIN SODIUM 40 MG PO TABS
40.0000 mg | ORAL_TABLET | Freq: Every day | ORAL | 0 refills | Status: DC
Start: 2020-04-27 — End: 2020-04-27

## 2020-04-27 MED ORDER — CARVEDILOL 3.125 MG PO TABS
3.1250 mg | ORAL_TABLET | Freq: Two times a day (BID) | ORAL | 0 refills | Status: DC
Start: 2020-04-27 — End: 2020-04-27

## 2020-04-27 MED FILL — CARVEDILOL 3.125 MG TABLET: 3.125 | 30 days supply | Qty: 60 | Fill #0

## 2020-04-27 MED FILL — ELIQUIS 2.5 MG TABLET: 2.5 | 14 days supply | Qty: 28 | Fill #0

## 2020-04-27 MED FILL — ASPIRIN LOW DOSE 81 MG TBEC: 81 | 30 days supply | Qty: 30 | Fill #0

## 2020-04-27 MED FILL — AMOX-CLAV 875-125 MG TABLET: 875-125 | 7 days supply | Qty: 10 | Fill #0

## 2020-04-27 MED FILL — ALBUTEROL SULFATE HFA 108 (: 108 (90 BAS | 16 days supply | Qty: 18 | Fill #0

## 2020-04-27 MED FILL — PRAVASTATIN NA 40 MG TAB: 40 | 30 days supply | Qty: 30 | Fill #0

## 2020-04-27 MED FILL — METHYLPREDNISOLONE 4 MG DOS: 4 | 6 days supply | Qty: 21 | Fill #0

## 2020-04-27 NOTE — Evaluation (Signed)
Clinical/Bedside Swallow Evaluation Patient Details  Name: Trevor Martinez MRN: [id number] Date of Birth: 02/14/1946  Today's Date: 04/27/2020 Time: SLP Start Time (ACUTE ONLY): 1039 SLP Stop Time (ACUTE ONLY): 1047 SLP Time Calculation (min) (ACUTE ONLY): 8 min  Past Medical History:  Past Medical History:  Diagnosis Date  . Hypertension    Past Surgical History: History reviewed. No pertinent surgical history. HPI:  74 y.o. male with history of hypertension and hyperlipidemia was brought to the ER after patient was found to be hypoxic febrile and was having some diarrhea and feeling weak. Patient's neighbor called in for a wellness check since patient was not seen outside the house for the last 2 weeks. +COVID   Assessment / Plan / Recommendation Clinical Impression  Pt denied history or current dysphagia and no difficulty identified by RN. Oropharyngeal swallow from subjective perspective appeared normal. No dyspnea with Covid with coordinated swallows. He is endentulous with timely and efficient mastication with solid. Continue regular/thin liquids, pt being discharged home today. No follow up needed.    SLP Visit Diagnosis: Dysphagia, unspecified (R13.10)    Aspiration Risk  No limitations    Diet Recommendation Regular;Thin liquid   Liquid Administration via: Cup;Straw Medication Administration: Whole meds with liquid Supervision: Patient able to self feed Postural Changes: Seated upright at 90 degrees    Other  Recommendations Oral Care Recommendations: Oral care BID   Follow up Recommendations None      Frequency and Duration            Prognosis        Swallow Study   General HPI: 74 y.o. male with history of hypertension and hyperlipidemia was brought to the ER after patient was found to be hypoxic febrile and was having some diarrhea and feeling weak. Patient's neighbor called in for a wellness check since patient was not seen outside the house for the last 2  weeks. +COVID Type of Study: Bedside Swallow Evaluation Previous Swallow Assessment:  (none) Diet Prior to this Study: Regular;Thin liquids Temperature Spikes Noted: No Respiratory Status: Room air History of Recent Intubation: No Behavior/Cognition: Alert;Cooperative;Pleasant mood Oral Cavity Assessment: Within Functional Limits Oral Care Completed by SLP: No Oral Cavity - Dentition: Edentulous Vision: Functional for self-feeding Self-Feeding Abilities: Able to feed self Patient Positioning: Upright in bed Baseline Vocal Quality: Normal Volitional Cough: Strong Volitional Swallow: Able to elicit    Oral/Motor/Sensory Function Overall Oral Motor/Sensory Function: Within functional limits   Ice Chips Ice chips: Not tested   Thin Liquid Thin Liquid: Within functional limits Presentation: Straw    Nectar Thick Nectar Thick Liquid: Not tested   Honey Thick Honey Thick Liquid: Not tested   Puree Puree: Not tested   Solid     Solid: Within functional limits      Royce Macadamia 04/27/2020,10:55 AM  Breck Coons Lonell Face.Ed Nurse, children's [phone] Office [phone]

## 2020-04-27 NOTE — Discharge Summary (Signed)
Trevor Martinez QPR:[id number] DOB: 1945-07-17 DOA: 04/18/2020  PCP: Patient, No Pcp Per  Admit date: 04/18/2020  Discharge date: 04/27/2020  Admitted From: Home  Disposition:  Home - refused SNF   Recommendations for Outpatient Follow-up:   Follow up with PCP in 1-2 weeks  PCP Please obtain BMP/CBC, 2 view CXR in 1week,  (see Discharge instructions)   PCP Please follow up on the following pending results: Check CBC, CMP, 2 view chest x-ray in 1 week   Home Health: RN, PT   Equipment/Devices: None  Consultations: None  Discharge Condition: Stable    CODE STATUS: Full    Diet Recommendation: Heart Healthy   Diet Order            Diet - low sodium heart healthy           Diet Heart Room service appropriate? Yes; Fluid consistency: Thin  Diet effective now                  Chief Complaint  Patient presents with  . Shortness of Breath     Brief history of present illness from the day of admission and additional interim summary     Patient is a 74 y.o. male with PMHx of HTN, HLD-who for the past 1 week or so-has been having cough, chest pain, diarrhea-neighbor called EMS-subsequent evaluation revealed acute hypoxic respiratory failure due to COVID-19 pneumonia  COVID-19 vaccinated status: Vaccinated  Significant Events: 11/1>> Admit to [facility] for hypoxia due to COVID-19 pneumonia  Significant studies: 10/31>>Chest x-ray: Interstitial opacities bilaterally, soft tissue fullness in the left hilar region. 11/1>> CTA chest: No PE, multifocal pneumonia, coronary artery calcifications.  No enlarged mediastinal lymph nodes. 11/2>> lower extremity Doppler: No DVT 11/6 TTE>>  Left ventricular ejection fraction, by estimation, is 65 to 70%. No WMA, Grade 1 Diastolic CHF    COVID-19  medications: Steroids: 10/31>> Remdesivir: 10/31>> 11/4 Baricitinib: 11/1>>11/9                                                                 Hospital Course   Acute Hypoxic Resp Failure due to Covid 19 Viral pneumonia and possible concurrent bacterial pneumonia: He moderate to severe parenchymal lung injury and was treated with combinations of steroids, Remdesivir and Baricitinib, much improved and symptom-free on room air at rest, will be given a steroid taper orally along with rescue inhaler and discharged home.  There is also a small hint of superimposed bacterial infection for which he will get 5 days of Augmentin, currently no productive cough however his CRP and procalcitonin had mildly bumped for which I am giving him 5 days of oral antibiotic upon discharge.    Recent Labs  Lab 04/21/20 0025 04/22/20 0306 04/23/20 0028 04/24/20 0343 04/25/20 0310 04/26/20 0344 04/27/20  0045  WBC 8.0   < > 5.8 7.3 9.5 7.9 7.2  CRP 9.6*   < > 3.1* 1.7* 0.9 10.3* 7.6*  DDIMER >20.00*   < > >20.00* 17.48* 11.15* 3.92* 2.34*  BNP  --   --   --   --  68.0 49.5 47.7  PROCALCITON 1.34  --   --   --   --  2.80 1.75  AST 37   < > 54* 36 32 38 26  ALT 25   < > 56* 42 37 36 31  ALKPHOS 38   < > 46 40 39 42 44  BILITOT 0.6   < > 0.6 0.4 0.5 0.7 0.8  ALBUMIN 2.3*   < > 2.2* 2.1* 2.0* 2.0* 1.9*   < > = values in this interval not displayed.       Asymptomatic 15 beat run of V. tach on 04/24/2020.  He has been switched from Norvasc to Coreg, electrolytes stable, symptom-free, echo shows a preserved EF without any wall motion abnormality.    EP may arrange one-time outpatient cardiology follow-up in the next few weeks.  Significantly elevated D-dimer: D-dimer continues to be significantly elevated-Doppler/CTA chest negative for VTE-treated with high-dose Lovenox and now D-dimer is serially trending down. 2 weeks Eliquis upon DC.  Transaminitis: Mild-likely secondary to COVID-19-stable for follow-up.   LFTs trending down towards normal.  PCP to recheck in 7 to 10 days.  HTN:  Placed on Coreg upon discharge.  HLD: Continue statin.  Debility/deconditioning: Secondary to COVID-19-much improved and wants to go home, will be discharged home with home health PT and RN.  Steroid-induced hyperglycemia: CBGs stable with SSI-A1c stable at 5.7. Steroids being tapered off.  Lab Results  Component Value Date   HGBA1C 5.7 (H) 04/22/2020     Discharge diagnosis     Active Problems:   Acute respiratory failure due to COVID-19 [facility])   Essential hypertension   Acute respiratory disease due to COVID-19 virus    Discharge instructions    Discharge Instructions    Diet - low sodium heart healthy   Complete by: As directed    Discharge instructions   Complete by: As directed    Follow with Primary MD in 7 days   Get CBC, CMP, 2 view Chest X ray -  checked next visit within 1 week by Primary MD   Activity: As tolerated with Full fall precautions use walker/cane & assistance as needed  Disposition Home   Diet: Heart Healthy    Special Instructions: If you have smoked or chewed Tobacco  in the last 2 yrs please stop smoking, stop any regular Alcohol  and or any Recreational drug use.  On your next visit with your primary care physician please Get Medicines reviewed and adjusted.  Please request your Prim.MD to go over all Hospital Tests and Procedure/Radiological results at the follow up, please get all Hospital records sent to your Prim MD by signing hospital release before you go home.  If you experience worsening of your admission symptoms, develop shortness of breath, life threatening emergency, suicidal or homicidal thoughts you must seek medical attention immediately by calling 911 or calling your MD immediately  if symptoms less severe.   Increase activity slowly   Complete by: As directed       Discharge Medications   Allergies as of 04/27/2020   No Known Allergies      Medication List    STOP taking these medications  lisinopril-hydrochlorothiazide 10-12.5 MG tablet Commonly known as: ZESTORETIC     TAKE these medications   albuterol 108 (90 Base) MCG/ACT inhaler Commonly known as: VENTOLIN HFA Inhale 2 puffs into the lungs every 4 (four) hours as needed for shortness of breath.   amoxicillin-clavulanate 875-125 MG tablet Commonly known as: Augmentin Take 1 tablet by mouth 2 (two) times daily for 7 days.   apixaban 2.5 MG Tabs tablet Commonly known as: Eliquis Take 1 tablet (2.5 mg total) by mouth 2 (two) times daily.   carvedilol 3.125 MG tablet Commonly known as: COREG Take 1 tablet (3.125 mg total) by mouth 2 (two) times daily with a meal.   methylPREDNISolone 4 MG Tbpk tablet Commonly known as: MEDROL DOSEPAK follow package directions   pravastatin 40 MG tablet Commonly known as: PRAVACHOL Take 1 tablet (40 mg total) by mouth daily with supper.        Contact information for follow-up providers    [facility]. Schedule an appointment as soon as possible for a visit in 1 week(s).   Contact information: [address] [place] [address] [phone]           Contact information for after-discharge care    Destination    [facility]erred [facility] .   Service: Skilled Nursing Contact information: [address] [place] [address] [phone]                  Major procedures and Radiology Reports - PLEASE review detailed and final reports thoroughly  -        CT ANGIO CHEST PE W OR WO CONTRAST  Result Date: 04/19/2020 CLINICAL DATA:  Hypoxic, fever.  COVID-19 positive. EXAM: CT ANGIOGRAPHY CHEST WITH CONTRAST TECHNIQUE: Multidetector CT imaging of the chest was performed using the standard protocol during bolus administration of intravenous contrast. Multiplanar CT image reconstructions and MIPs were obtained to evaluate the vascular  anatomy. CONTRAST:  75mL OMNIPAQUE IOHEXOL 350 MG/ML SOLN COMPARISON:  None. FINDINGS: Cardiovascular: Satisfactory opacification of the pulmonary arteries to the segmental level. No evidence of pulmonary embolism. Normal heart size. No pericardial effusion. Mild coronary artery calcifications are noted. Mediastinum/Nodes: No enlarged mediastinal, hilar, or axillary lymph nodes. Thyroid gland, trachea, and esophagus demonstrate no significant findings. Lungs/Pleura: No pneumothorax or pleural effusion is noted. Bilateral multiple airspace opacities are noted consistent with multifocal pneumonia due to COVID-19. Upper Abdomen: No acute abnormality. Musculoskeletal: No chest wall abnormality. No acute or significant osseous findings. Review of the MIP images confirms the above findings. IMPRESSION: 1. No definite evidence of pulmonary embolus. 2. Mild coronary artery calcifications are noted suggesting coronary artery disease. 3. Bilateral multiple airspace opacities are noted consistent with multifocal pneumonia due to COVID-19. Electronically Signed   By: Lupita Raider M.D.   On: 04/19/2020 09:55   DG Chest Port 1 View  Result Date: 04/26/2020 CLINICAL DATA:  Shortness of breath. EXAM: PORTABLE CHEST 1 VIEW COMPARISON:  April 18, 2020. FINDINGS: Stable cardiomediastinal silhouette. No pneumothorax is noted. Increased bilateral lung opacities are noted, left greater than right, concerning for multifocal pneumonia. No significant effusion is noted. Bony thorax is unremarkable. IMPRESSION: Increased bilateral lung opacities are noted, left greater than right, concerning for multifocal pneumonia. Electronically Signed   By: Lupita Raider M.D.   On: 04/26/2020 08:02   DG Chest Port 1 View  Result Date: 04/18/2020 CLINICAL DATA:  Shortness of breath. EXAM: PORTABLE CHEST 1 VIEW COMPARISON:  None. FINDINGS: The heart is normal in size. There is soft tissue fullness in the left hilar region. Diffuse fine  interstitial opacities, left greater than right and most prominent in the lower lung zones. There is slightly more patchy opacity at the left lung base. No pneumothorax or large pleural effusion. Biapical pleuroparenchymal scarring. No acute osseous abnormalities are seen. IMPRESSION: 1. Diffuse fine interstitial opacities, left greater than right and most prominent in the lower lung zones. Differential considerations favor infection, less likely pulmonary edema, neoplasm, or interstitial lung disease. 2. Soft tissue fullness in the left hilar region. Suspect underlying adenopathy. 3. Recommend clinical correlation. Radiographic follow-up is recommended after course of treatment. Should hilar fullness persist, recommend further characterization with chest CT. Electronically Signed   By: Narda Rutherford M.D.   On: 04/18/2020 18:40   VAS [place] LOWER EXTREMITY VENOUS (DVT)  Result Date: 04/20/2020  Lower Venous DVT Study Indications: Elevated d-dimer= 17.35.  Comparison Study: No prior study Performing Technologist: Gertie Fey MHA, RDMS, RVT, RDCS  Examination Guidelines: A complete evaluation includes B-mode imaging, spectral Doppler, color Doppler, and power Doppler as needed of all accessible portions of each vessel. Bilateral testing is considered an integral part of a complete examination. Limited examinations for reoccurring indications may be performed as noted. The reflux portion of the exam is performed with the patient in reverse Trendelenburg.  +---------+---------------+---------+-----------+----------+--------------+ RIGHT    CompressibilityPhasicitySpontaneityPropertiesThrombus Aging +---------+---------------+---------+-----------+----------+--------------+ CFV      Full           Yes      Yes                                 +---------+---------------+---------+-----------+----------+--------------+ SFJ      Full                                                         +---------+---------------+---------+-----------+----------+--------------+ FV Prox  Full                                                        +---------+---------------+---------+-----------+----------+--------------+ FV Mid   Full                                                        +---------+---------------+---------+-----------+----------+--------------+ FV DistalFull                                                        +---------+---------------+---------+-----------+----------+--------------+ PFV      Full                                                        +---------+---------------+---------+-----------+----------+--------------+  POP      Full           Yes      Yes                                 +---------+---------------+---------+-----------+----------+--------------+ PTV      Full                                                        +---------+---------------+---------+-----------+----------+--------------+ PERO     Full                                                        +---------+---------------+---------+-----------+----------+--------------+   +---------+---------------+---------+-----------+----------+--------------+ LEFT     CompressibilityPhasicitySpontaneityPropertiesThrombus Aging +---------+---------------+---------+-----------+----------+--------------+ CFV      Full           Yes      Yes                                 +---------+---------------+---------+-----------+----------+--------------+ SFJ      Full                                                        +---------+---------------+---------+-----------+----------+--------------+ FV Prox  Full                                                        +---------+---------------+---------+-----------+----------+--------------+ FV Mid   Full                                                         +---------+---------------+---------+-----------+----------+--------------+ FV DistalFull                                                        +---------+---------------+---------+-----------+----------+--------------+ PFV      Full                                                        +---------+---------------+---------+-----------+----------+--------------+ POP      Full           Yes      Yes                                 +---------+---------------+---------+-----------+----------+--------------+  PTV      Full                                                        +---------+---------------+---------+-----------+----------+--------------+ PERO     Full                                                        +---------+---------------+---------+-----------+----------+--------------+     Summary: RIGHT: - There is no evidence of deep vein thrombosis in the lower extremity.  - No cystic structure found in the popliteal fossa.  LEFT: - There is no evidence of deep vein thrombosis in the lower extremity.  - No cystic structure found in the popliteal fossa.  *See table(s) above for measurements and observations. Electronically signed by Waverly Ferrari MD on 04/20/2020 at 5:52:40 PM.    Final    ECHOCARDIOGRAM LIMITED  Result Date: 04/24/2020    ECHOCARDIOGRAM REPORT   Patient Name:   TYLIEK TIMBERMAN Date of Exam: 04/24/2020 Medical Rec #:  [id number]       Height:       70.0 in Accession #:    [id number]      Weight:       135.0 lb Date of Birth:  July 19, 1945       BSA:          1.766 m Patient Age:    74 years        BP:           154/82 mmHg Patient Gender: M               HR:           88 bpm. Exam Location:  Inpatient Procedure: Limited Color Doppler, Cardiac Doppler and Limited Echo Indications:    CHF-Acute Diastolic 428.31 / I50.31  History:        Patient has no prior history of Echocardiogram examinations.                 Risk Factors:Hypertension and Dyslipidemia.  COVID 19 pneumonia.                 Cough. Chest Pain.  Sonographer:    Leta Jungling RDCS Referring Phys: Effie Shy [facility] IMPRESSIONS  1. Left ventricular ejection fraction, by estimation, is 65 to 70%. The left ventricle has normal function. The left ventricle has no regional wall motion abnormalities. There is mild concentric left ventricular hypertrophy. Left ventricular diastolic parameters are consistent with Grade I diastolic dysfunction (impaired relaxation). Elevated left atrial pressure.  2. Right ventricular systolic function is normal. The right ventricular size is normal.  3. The mitral valve is normal in structure. No evidence of mitral valve regurgitation. No evidence of mitral stenosis.  4. The aortic valve is normal in structure. Aortic valve regurgitation is not visualized. No aortic stenosis is present.  5. The inferior vena cava is normal in size with greater than 50% respiratory variability, suggesting right atrial pressure of 3 mmHg. FINDINGS  Left Ventricle: Left ventricular ejection fraction, by estimation, is 65 to 70%. The left ventricle has normal function. The left ventricle has no regional  wall motion abnormalities. The left ventricular internal cavity size was normal in size. There is  mild concentric left ventricular hypertrophy. Left ventricular diastolic parameters are consistent with Grade I diastolic dysfunction (impaired relaxation). Elevated left atrial pressure. Right Ventricle: The right ventricular size is normal. No increase in right ventricular wall thickness. Right ventricular systolic function is normal. Left Atrium: Left atrial size was normal in size. Right Atrium: Right atrial size was normal in size. Pericardium: There is no evidence of pericardial effusion. Mitral Valve: The mitral valve is normal in structure. No evidence of mitral valve regurgitation. No evidence of mitral valve stenosis. Tricuspid Valve: The tricuspid valve is normal in structure. Tricuspid  valve regurgitation is not demonstrated. No evidence of tricuspid stenosis. Aortic Valve: The aortic valve is normal in structure. Aortic valve regurgitation is not visualized. No aortic stenosis is present. Pulmonic Valve: The pulmonic valve was normal in structure. Pulmonic valve regurgitation is not visualized. No evidence of pulmonic stenosis. Aorta: The aortic root is normal in size and structure. Venous: The inferior vena cava is normal in size with greater than 50% respiratory variability, suggesting right atrial pressure of 3 mmHg. IAS/Shunts: No atrial level shunt detected by color flow Doppler.  LEFT VENTRICLE PLAX 2D LVIDd:         3.40 cm  Diastology LVIDs:         2.30 cm  LV e' medial:    7.40 cm/s LV PW:         0.90 cm  LV E/e' medial:  13.0 LV IVS:        1.00 cm  LV e' lateral:   9.68 cm/s LVOT diam:     1.90 cm  LV E/e' lateral: 10.0 LV SV:         60 LV SV Index:   34 LVOT Area:     2.84 cm  LEFT ATRIUM           Index       RIGHT ATRIUM          Index LA diam:      3.10 cm 1.76 cm/m  RA Area:     8.50 cm LA Vol (A4C): 20.5 ml 11.61 ml/m RA Volume:   14.70 ml 8.32 ml/m  AORTIC VALVE LVOT Vmax:   131.00 cm/s LVOT Vmean:  72.400 cm/s LVOT VTI:    0.211 m  AORTA Ao Root diam: 2.80 cm MITRAL VALVE MV Area (PHT): 3.03 cm    SHUNTS MV Decel Time: 250 msec    Systemic VTI:  0.21 m MV E velocity: 96.50 cm/s  Systemic Diam: 1.90 cm MV A velocity: 82.50 cm/s MV E/A ratio:  1.17 Tobias Alexander MD Electronically signed by Tobias Alexander MD Signature Date/Time: 04/24/2020/3:13:30 PM    Final     Micro Results     Recent Results (from the past 240 hour(s))  Respiratory Panel by RT PCR (Flu A&B, Covid) - Nasopharyngeal Swab     Status: Abnormal   Collection Time: 04/18/20  6:09 PM   Specimen: Nasopharyngeal Swab  Result Value Ref Range Status   SARS Coronavirus 2 by RT PCR POSITIVE (A) NEGATIVE Final    Comment: RESULT CALLED TO, READ BACK BY AND VERIFIED WITH: DR D RAY  04/18/20 BY S  GEZAHEGN (NOTE) SARS-CoV-2 target nucleic acids are DETECTED.  SARS-CoV-2 RNA is generally detectable in upper respiratory specimens  during the acute phase of infection. Positive results are indicative of the presence of the identified  virus, but do not rule out bacterial infection or co-infection with other pathogens not detected by the test. Clinical correlation with patient history and other diagnostic information is necessary to determine patient infection status. The expected result is Negative.  Fact Sheet for Patients:  https://www.moore.com/  Fact Sheet for Healthcare Providers: https://www.young.biz/  This test is not yet approved or cleared by the [place] FDA and  has been authorized for detection and/or diagnosis of SARS-CoV-2 by FDA under an Emergency Use Authorization (EUA).  This EUA will remain in effect (meaning this test can  be used) for the duration of  the COVID-19 declaration under Section 564(b)(1) of the Act, 21 U.S.C. section 360bbb-3(b)(1), unless the authorization is terminated or revoked sooner.      Influenza A by PCR NEGATIVE NEGATIVE Final   Influenza B by PCR NEGATIVE NEGATIVE Final    Comment: (NOTE) The Xpert Xpress SARS-CoV-2/FLU/RSV assay is intended as an aid in  the diagnosis of influenza from Nasopharyngeal swab specimens and  should not be used as a sole basis for treatment. Nasal washings and  aspirates are unacceptable for Xpert Xpress SARS-CoV-2/FLU/RSV  testing.  Fact Sheet for Patients: https://www.moore.com/  Fact Sheet for Healthcare Providers: https://www.young.biz/  This test is not yet approved or cleared by the [place] FDA and  has been authorized for detection and/or diagnosis of SARS-CoV-2 by  FDA under an Emergency Use Authorization (EUA). This EUA will remain  in effect (meaning this test can be used) for the duration of the   Covid-19 declaration under Section 564(b)(1) of the Act, 21  U.S.C. section 360bbb-3(b)(1), unless the authorization is  terminated or revoked. Performed at [facility], [address], [place], [place] [address]   Blood Culture (routine x 2)     Status: None   Collection Time: 04/18/20  6:09 PM   Specimen: BLOOD  Result Value Ref Range Status   Specimen Description BLOOD RIGHT ANTECUBITAL  Final   Special Requests   Final    BOTTLES DRAWN AEROBIC AND ANAEROBIC Blood Culture adequate volume   Culture   Final    NO GROWTH 5 DAYS Performed at [facility], [address], [place], [place] [address]    Report Status 04/23/2020 FINAL  Final  Blood Culture (routine x 2)     Status: None   Collection Time: 04/19/20  4:41 AM   Specimen: BLOOD  Result Value Ref Range Status   Specimen Description BLOOD LEFT ANTECUBITAL  Final   Special Requests   Final    BOTTLES DRAWN AEROBIC AND ANAEROBIC Blood Culture results may not be optimal due to an excessive volume of blood received in culture bottles   Culture   Final    NO GROWTH 5 DAYS Performed at [facility], [address] [place], [place] [address]    Report Status 04/24/2020 FINAL  Final  MRSA PCR Screening     Status: None   Collection Time: 04/26/20 12:38 PM   Specimen: Nasal Mucosa; Nasopharyngeal  Result Value Ref Range Status   MRSA by PCR NEGATIVE NEGATIVE Final    Comment:        The GeneXpert MRSA Assay (FDA approved for NASAL specimens only), is one component of a comprehensive MRSA colonization surveillance program. It is not intended to diagnose MRSA infection nor to guide or monitor treatment for MRSA infections. Performed at [facility], [address] [place], [place] [address]  Today   Subjective    Trevor Martinez today has no headache,no chest abdominal pain,no new weakness tingling or numbness, feels much better wants to go home today.     Objective   Blood pressure  122/72, pulse 73, temperature 98.5 F (36.9 C), temperature source Oral, resp. rate 18, height 5\' 10"  (1.778 m), weight 61.2 kg, SpO2 90 %.   Intake/Output Summary (Last 24 hours) at 04/27/2020 0939 Last data filed at 04/27/2020 0306 Gross per 24 hour  Intake 320 ml  Output --  Net 320 ml    Exam  Awake Alert, No new F.N deficits, Normal affect [place].AT,PERRAL Supple Neck,No JVD, No cervical lymphadenopathy appriciated.  Symmetrical Chest wall movement, Good air movement bilaterally, CTAB RRR,No Gallops,Rubs or new Murmurs, No Parasternal Heave +ve B.Sounds, Abd Soft, Non tender, No organomegaly appriciated, No rebound -guarding or rigidity. No Cyanosis, Clubbing or edema, No new Rash or bruise   Data Review   CBC w Diff:  Lab Results  Component Value Date   WBC 7.2 04/27/2020   HGB 10.9 (L) 04/27/2020   HCT 32.7 (L) 04/27/2020   PLT 247 04/27/2020   LYMPHOPCT 5 04/23/2020   MONOPCT 4 04/23/2020   EOSPCT 0 04/23/2020   BASOPCT 0 04/23/2020    CMP:  Lab Results  Component Value Date   NA 138 04/27/2020   K 4.2 04/27/2020   CL 108 04/27/2020   CO2 25 04/27/2020   BUN 30 (H) 04/27/2020   CREATININE 0.92 04/27/2020   PROT 4.9 (L) 04/27/2020   ALBUMIN 1.9 (L) 04/27/2020   BILITOT 0.8 04/27/2020   ALKPHOS 44 04/27/2020   AST 26 04/27/2020   ALT 31 04/27/2020  .   Total Time in preparing paper work, data evaluation and todays exam - 35 minutes  Susa RaringPrashant Lotta Frankenfield M.D on 04/27/2020 at 9:39 AM  [facility]   Office  [phone]

## 2020-04-27 NOTE — Care Management Important Message (Signed)
Important Message  Patient Details  Name: Trevor Martinez MRN: [id number] Date of Birth: [birth year]   Medicare Important Message Given:  Yes - Important Message mailed due to current National Emergency  Verbal consent obtained due to current National Emergency  Relationship to patient: Self Contact Name: Clement Deneault Call Date: 04/27/20  Time: 1003 Phone: [phone] Outcome: No Answer/Busy Important Message mailed to: Patient address on file    Orson Aloe 04/27/2020, 10:03 AM

## 2020-04-27 NOTE — TOC Transition Note (Signed)
Transition of Care [facility]) - CM/SW Discharge Note   Patient Details  Name: Trevor Martinez MRN: [id number] Date of Birth: 1945/09/22  [facility] CM/SW Contact:  Lawerance Sabal, RN Phone Number: 04/27/2020, 12:36 PM   Clinical Narrative:   Patient decided to go home on day of DC instead of SNF as originally planned. CSW discussed this with his sister, she is supportive of decision. HH services set up through his Medicare with [facility]. Home oxygen set up with [employer] through his Medicare as well. POC has been delivered to the room, and [employer] will arrange home unit delivery with the sister since patient is [facility]. CM faxed DC notes and orders to the [facility] and notified the CSW of DC and needs. Patient has PCP apt 11/29 and sister will transport. Sister is arranging a friend to provide transport at DC. Co-pays for medications at DC through [facility] pharmacy are $75. Spoke w the sister and she cannot pay for meds, patient unable to pay for meds as well, [facility], however meds will be mailed and they would not get there until next week or later. Spoke w Alinda Money [facility] pharmacist. Collection taken up in pharmacy to cover pay and meds will be sent home with patient.      Final next level of care: Home w Home Health Services Barriers to Discharge: No Barriers Identified   Patient Goals and CMS Choice Patient states their goals for this hospitalization and ongoing recovery are:: to go home CMS Medicare.gov Compare Post Acute Care list provided to:: Patient Choice offered to / list presented to : Patient  Discharge Placement                       Discharge Plan and Services In-house Referral: Clinical Social Work   Post Acute Care Choice: Skilled Nursing Facility          DME Arranged: Oxygen DME Agency: [employer] Date DME Agency Contacted: 04/27/20 Time DME Agency Contacted: 1113 Representative spoke with at DME Agency: [facility]  Arranged: RN, PT HH Agency: [facility] Date [facility] Agency Contacted: 04/27/20 Time HH Agency Contacted: 1236 Representative spoke with at [facility] Agency: Kandee Keen  Social Determinants of Health (SDOH) Interventions     Readmission Risk Interventions No flowsheet data found.

## 2020-04-27 NOTE — Progress Notes (Signed)
Trevor Martinez to be D/C'd Home per MD order.  Discussed with the patient and all questions fully answered.  VSS, Skin clean, dry and intact without evidence of skin break down, no evidence of skin tears noted. IV catheter discontinued intact. Site without signs and symptoms of complications. Dressing and pressure applied.  An After Visit Summary was printed and given to the patient. Patient received prescription.  D/c education completed with patient/family including follow up instructions, medication list, d/c activities limitations if indicated, with other d/c instructions as indicated by MD - patient able to verbalize understanding, all questions fully answered.   Patient instructed to return to ED, call 911, or call MD for any changes in condition.   Patient escorted via WC, and D/C home via private auto.  [name] 04/27/2020 4:22 PM

## 2020-04-27 NOTE — Progress Notes (Signed)
SATURATION QUALIFICATIONS: (This note is used to comply with regulatory documentation for home oxygen)  Patient Saturations on Room Air at Rest = 93%  Patient Saturations on Room Air while Ambulating = 84%  Patient Saturations on 3 Liters of oxygen while Ambulating = 94%  Please briefly explain why patient needs home oxygen: pts oxygen dropped bellow 88% on room air while ambulating

## 2020-04-27 NOTE — Discharge Instructions (Signed)
Follow with Primary MD in 7 days   Get CBC, CMP, 2 view Chest X ray -  checked next visit within 1 week by Primary MD   Activity: As tolerated with Full fall precautions use walker/cane & assistance as needed  Disposition Home   Diet: Heart Healthy    Special Instructions: If you have smoked or chewed Tobacco  in the last 2 yrs please stop smoking, stop any regular Alcohol  and or any Recreational drug use.  On your next visit with your primary care physician please Get Medicines reviewed and adjusted.  Please request your Prim.MD to go over all Hospital Tests and Procedure/Radiological results at the follow up, please get all Hospital records sent to your Prim MD by signing hospital release before you go home.  If you experience worsening of your admission symptoms, develop shortness of breath, life threatening emergency, suicidal or homicidal thoughts you must seek medical attention immediately by calling 911 or calling your MD immediately  if symptoms less severe.        Person Under Monitoring Name: Trevor Martinez  Location: [address] [place] [address]   Infection Prevention Recommendations for Individuals Confirmed to have, or Being Evaluated for, 2019 Novel Coronavirus (COVID-19) Infection Who Receive Care at Home  Individuals who are confirmed to have, or are being evaluated for, COVID-19 should follow the prevention steps below until a healthcare provider or local or state health department says they can return to normal activities.  Stay home except to get medical care You should restrict activities outside your home, except for getting medical care. Do not go to work, school, or public areas, and do not use public transportation or taxis.  Call ahead before visiting your doctor Before your medical appointment, call the healthcare provider and tell them that you have, or are being evaluated for, COVID-19 infection. This will help the healthcare  provider's office take steps to keep other people from getting infected. Ask your healthcare provider to call the local or state health department.  Monitor your symptoms Seek prompt medical attention if your illness is worsening (e.g., difficulty breathing). Before going to your medical appointment, call the healthcare provider and tell them that you have, or are being evaluated for, COVID-19 infection. Ask your healthcare provider to call the local or state health department.  Wear a facemask You should wear a facemask that covers your nose and mouth when you are in the same room with other people and when you visit a healthcare provider. People who live with or visit you should also wear a facemask while they are in the same room with you.  Separate yourself from other people in your home As much as possible, you should stay in a different room from other people in your home. Also, you should use a separate bathroom, if available.  Avoid sharing household items You should not share dishes, drinking glasses, cups, eating utensils, towels, bedding, or other items with other people in your home. After using these items, you should wash them thoroughly with soap and water.  Cover your coughs and sneezes Cover your mouth and nose with a tissue when you cough or sneeze, or you can cough or sneeze into your sleeve. Throw used tissues in a lined trash can, and immediately wash your hands with soap and water for at least 20 seconds or use an alcohol-based hand rub.  Wash your [employer] your hands often and thoroughly with soap and water for  at least 20 seconds. You can use an alcohol-based hand sanitizer if soap and water are not available and if your hands are not visibly dirty. Avoid touching your eyes, nose, and mouth with unwashed hands.   Prevention Steps for Caregivers and Household Members of Individuals Confirmed to have, or Being Evaluated for, COVID-19 Infection Being Cared for  in the Home  If you live with, or provide care at home for, a person confirmed to have, or being evaluated for, COVID-19 infection please follow these guidelines to prevent infection:  Follow healthcare provider's instructions Make sure that you understand and can help the patient follow any healthcare provider instructions for all care.  Provide for the patient's basic needs You should help the patient with basic needs in the home and provide support for getting groceries, prescriptions, and other personal needs.  Monitor the patient's symptoms If they are getting sicker, call his or her medical provider and tell them that the patient has, or is being evaluated for, COVID-19 infection. This will help the healthcare provider's office take steps to keep other people from getting infected. Ask the healthcare provider to call the local or state health department.  Limit the number of people who have contact with the patient  If possible, have only one caregiver for the patient.  Other household members should stay in another home or place of residence. If this is not possible, they should stay  in another room, or be separated from the patient as much as possible. Use a separate bathroom, if available.  Restrict visitors who do not have an essential need to be in the home.  Keep older adults, very young children, and other sick people away from the patient Keep older adults, very young children, and those who have compromised immune systems or chronic health conditions away from the patient. This includes people with chronic heart, lung, or kidney conditions, diabetes, and cancer.  Ensure good ventilation Make sure that shared spaces in the home have good air flow, such as from an air conditioner or an opened window, weather permitting.  Wash your hands often  Wash your hands often and thoroughly with soap and water for at least 20 seconds. You can use an alcohol based hand sanitizer  if soap and water are not available and if your hands are not visibly dirty.  Avoid touching your eyes, nose, and mouth with unwashed hands.  Use disposable paper towels to dry your hands. If not available, use dedicated cloth towels and replace them when they become wet.  Wear a facemask and gloves  Wear a disposable facemask at all times in the room and gloves when you touch or have contact with the patient's blood, body fluids, and/or secretions or excretions, such as sweat, saliva, sputum, nasal mucus, vomit, urine, or feces.  Ensure the mask fits over your nose and mouth tightly, and do not touch it during use.  Throw out disposable facemasks and gloves after using them. Do not reuse.  Wash your hands immediately after removing your facemask and gloves.  If your personal clothing becomes contaminated, carefully remove clothing and launder. Wash your hands after handling contaminated clothing.  Place all used disposable facemasks, gloves, and other waste in a lined container before disposing them with other household waste.  Remove gloves and wash your hands immediately after handling these items.  Do not share dishes, glasses, or other household items with the patient  Avoid sharing household items. You should not share dishes, drinking  glasses, cups, eating utensils, towels, bedding, or other items with a patient who is confirmed to have, or being evaluated for, COVID-19 infection.  After the person uses these items, you should wash them thoroughly with soap and water.  Wash laundry thoroughly  Immediately remove and wash clothes or bedding that have blood, body fluids, and/or secretions or excretions, such as sweat, saliva, sputum, nasal mucus, vomit, urine, or feces, on them.  Wear gloves when handling laundry from the patient.  Read and follow directions on labels of laundry or clothing items and detergent. In general, wash and dry with the warmest temperatures recommended on  the label.  Clean all areas the individual has used often  Clean all touchable surfaces, such as counters, tabletops, doorknobs, bathroom fixtures, toilets, phones, keyboards, tablets, and bedside tables, every day. Also, clean any surfaces that may have blood, body fluids, and/or secretions or excretions on them.  Wear gloves when cleaning surfaces the patient has come in contact with.  Use a diluted bleach solution (e.g., dilute bleach with 1 part bleach and 10 parts water) or a household disinfectant with a label that says EPA-registered for coronaviruses. To make a bleach solution at home, add 1 tablespoon of bleach to 1 quart (4 cups) of water. For a larger supply, add  cup of bleach to 1 gallon (16 cups) of water.  Read labels of cleaning products and follow recommendations provided on product labels. Labels contain instructions for safe and effective use of the cleaning product including precautions you should take when applying the product, such as wearing gloves or eye protection and making sure you have good ventilation during use of the product.  Remove gloves and wash hands immediately after cleaning.  Monitor yourself for signs and symptoms of illness Caregivers and household members are considered close contacts, should monitor their health, and will be asked to limit movement outside of the home to the extent possible. Follow the monitoring steps for close contacts listed on the symptom monitoring form.   ? If you have additional questions, contact your local health department or call the epidemiologist on call at [phone] (available 24/7). ? This guidance is subject to change. For the most up-to-date guidance from [facility], please refer to their website: [email or web address]

## 2022-05-11 IMAGING — CT CT ANGIO CHEST
2 of 7 series · 19 of 46 positions shown · IV contrast (omnipaque)
Comparison: None.

CLINICAL DATA: Hypoxic, fever.  CNU6Z-Q1 positive.

EXAM:
CT ANGIOGRAPHY CHEST WITH CONTRAST
TECHNIQUE: Multidetector CT imaging of the chest was performed using the
standard protocol during bolus administration of intravenous
contrast. Multiplanar CT image reconstructions and MIPs were
obtained to evaluate the vascular anatomy.
CONTRAST:  75mL OMNIPAQUE IOHEXOL 350 MG/ML SOLN

[Series 6: thins · axial · 0.74mm/px · z∈[+1008,+1323]mm · 16 of 353 slices shown]
[im 19/353  lung]
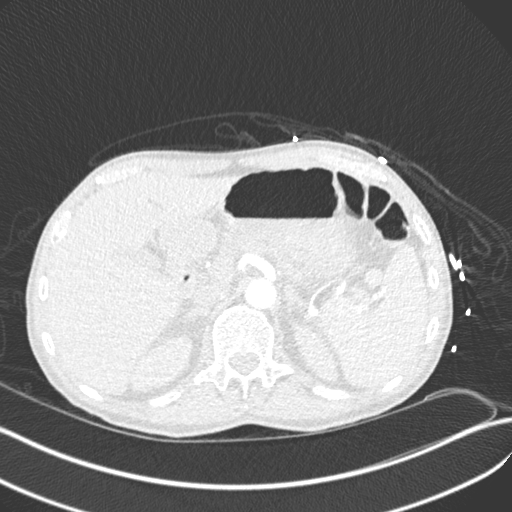
[im 38/353  soft-tissue]
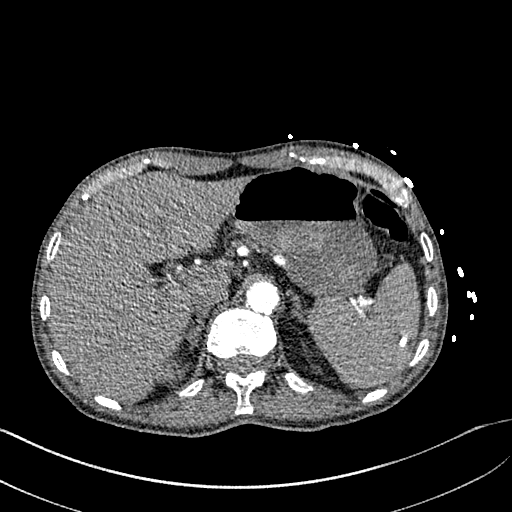
[im 56/353  lung]
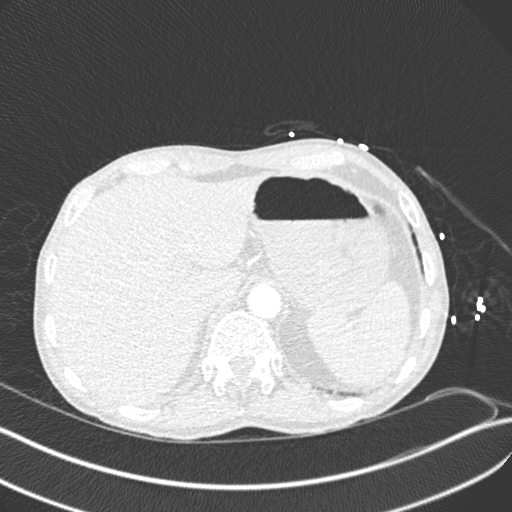
[im 75/353  soft-tissue]
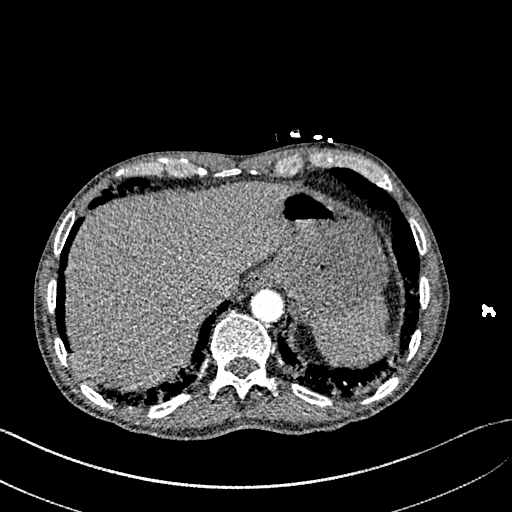
[im 112/353  lung]
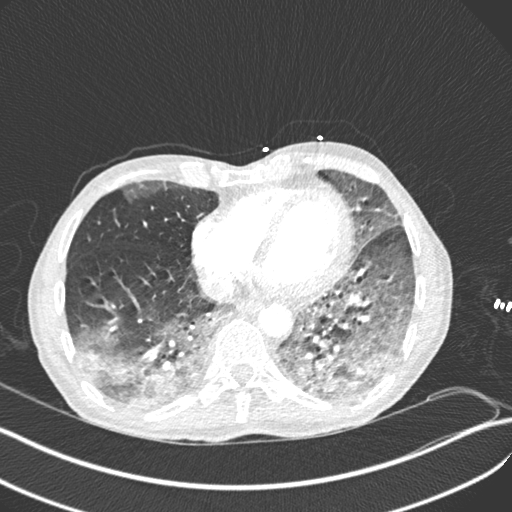
[im 130/353  soft-tissue]
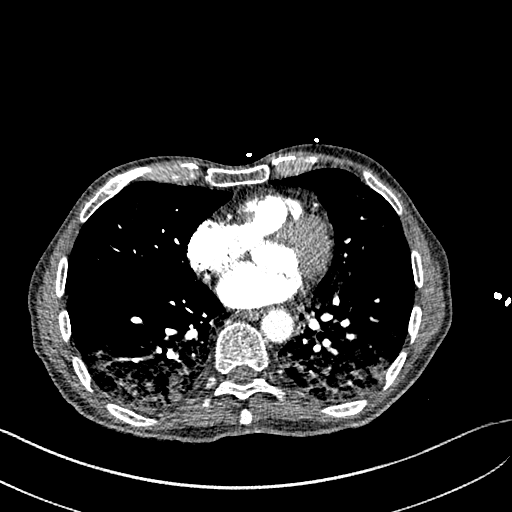
[im 149/353  lung]
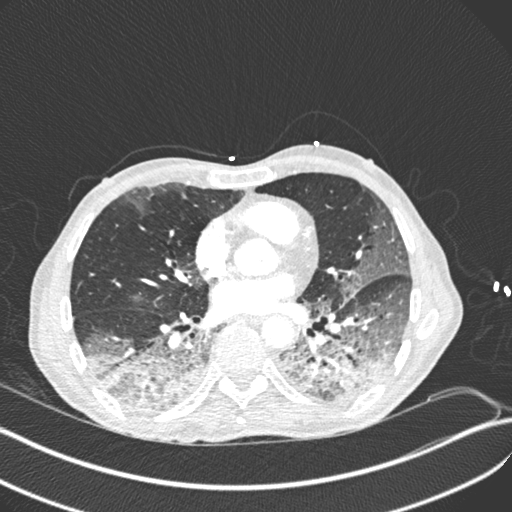
[im 167/353  soft-tissue]
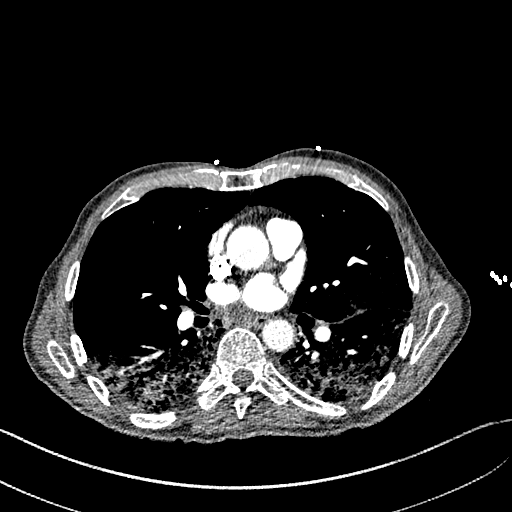
[im 186/353  lung]
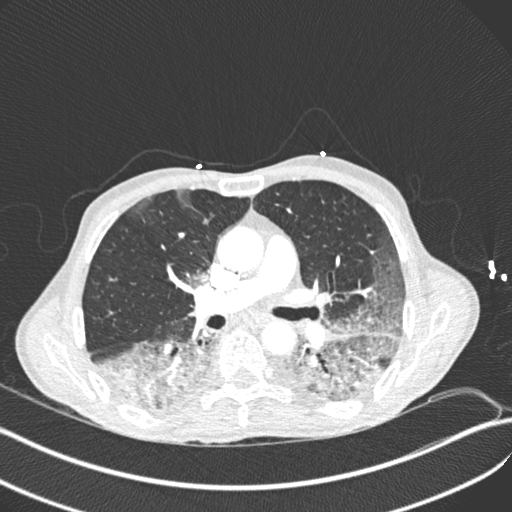
[im 204/353  soft-tissue]
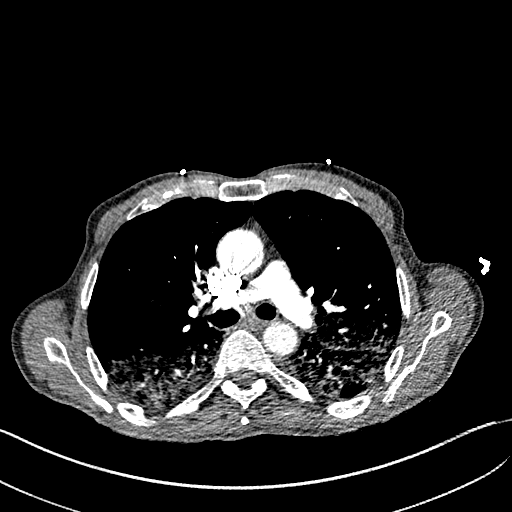
[im 223/353  lung]
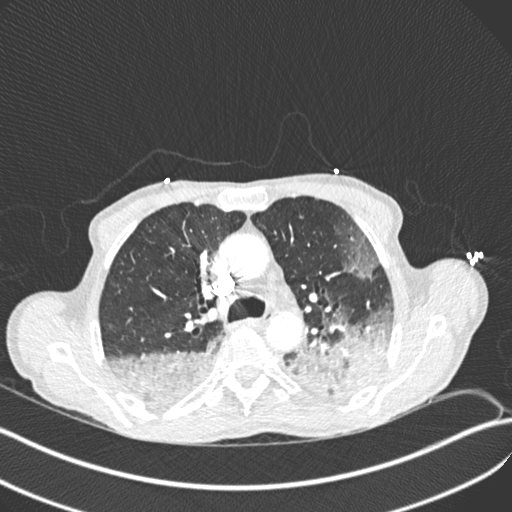
[im 241/353  soft-tissue]
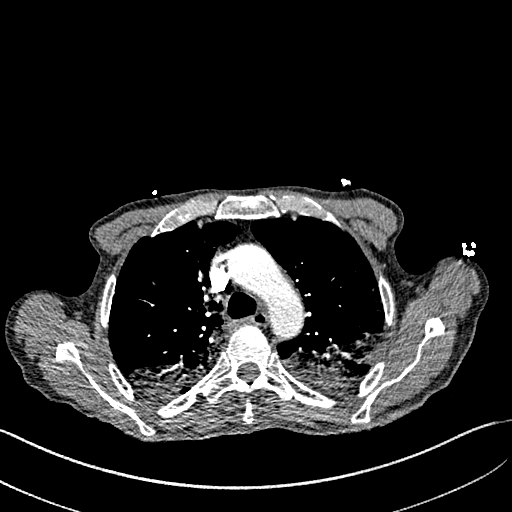
[im 278/353  lung]
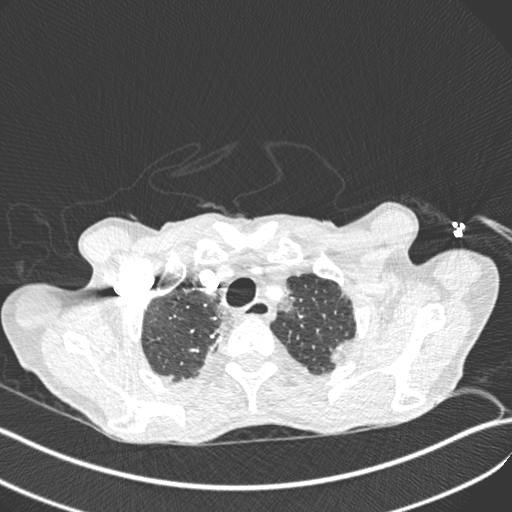
[im 297/353  soft-tissue]
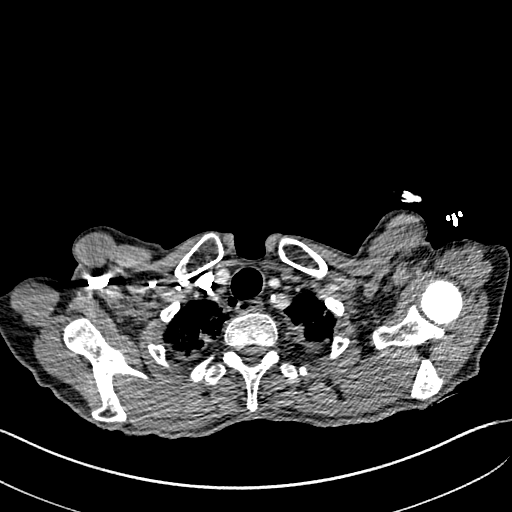
[im 315/353  lung]
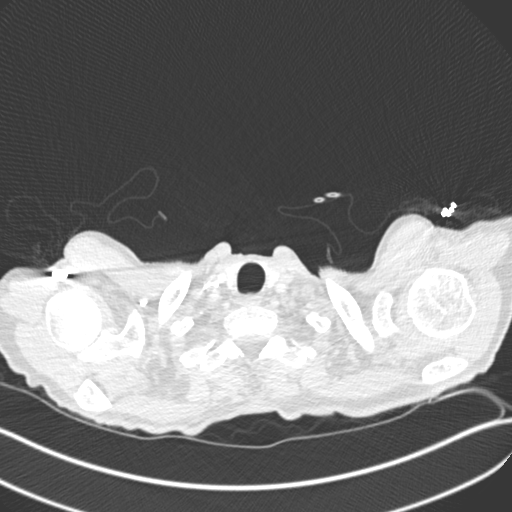
[im 334/353  soft-tissue]
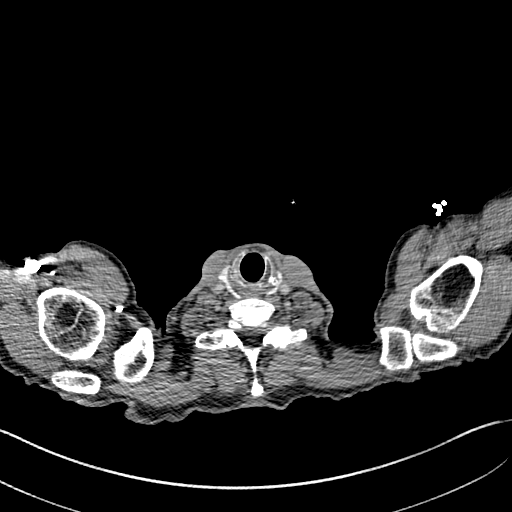

[Series 8: coronal mpr · coronal · 0.69mm/px · 3 of 128 slices shown]
[im 32/128  soft-tissue]
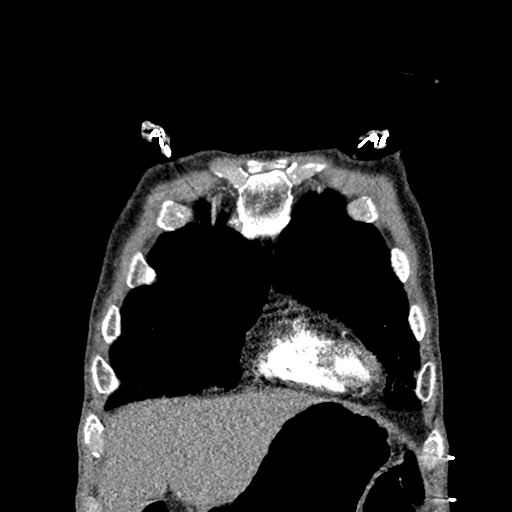
[im 64/128  soft-tissue]
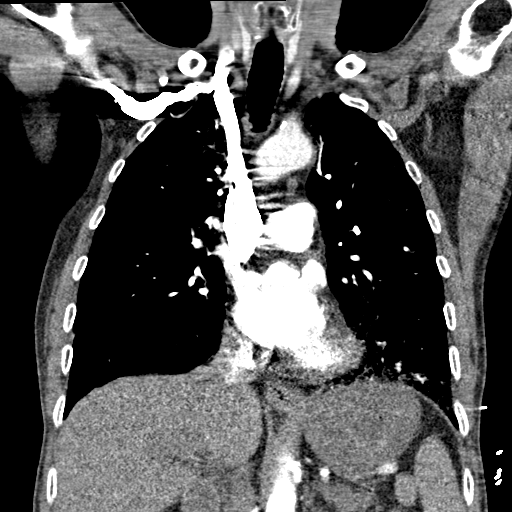
[im 96/128  soft-tissue]
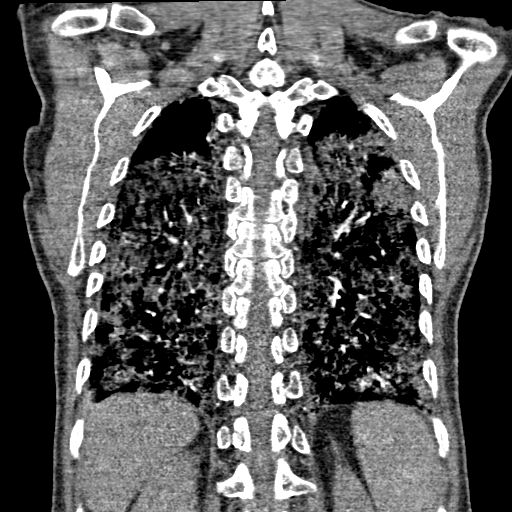

[19 of 46 positions shown; findings below may reference images not displayed]

FINDINGS: Cardiovascular: Satisfactory opacification of the pulmonary arteries
to the segmental level. No evidence of pulmonary embolism. Normal
heart size. No pericardial effusion. Mild coronary artery
calcifications are noted.

Mediastinum/Nodes: No enlarged mediastinal, hilar, or axillary lymph
nodes. Thyroid gland, trachea, and esophagus demonstrate no
significant findings.

Lungs/Pleura: No pneumothorax or pleural effusion is noted.
Bilateral multiple airspace opacities are noted consistent with
multifocal pneumonia due to CNU6Z-Q1.

Upper Abdomen: No acute abnormality.

Musculoskeletal: No chest wall abnormality. No acute or significant
osseous findings.

Review of the MIP images confirms the above findings.
IMPRESSION: 1. No definite evidence of pulmonary embolus.
2. Mild coronary artery calcifications are noted suggesting coronary
artery disease.
3. Bilateral multiple airspace opacities are noted consistent with
multifocal pneumonia due to CNU6Z-Q1.
